# Patient Record
Sex: Female | Born: 1950
Health system: Southern US, Community
[De-identification: ages and names within clinical notes are randomized; demographics above are authoritative.]

## PROBLEM LIST (undated history)

## (undated) DIAGNOSIS — K219 Gastro-esophageal reflux disease without esophagitis: Secondary | ICD-10-CM

## (undated) DIAGNOSIS — M1990 Unspecified osteoarthritis, unspecified site: Secondary | ICD-10-CM

## (undated) DIAGNOSIS — J309 Allergic rhinitis, unspecified: Secondary | ICD-10-CM

## (undated) DIAGNOSIS — E785 Hyperlipidemia, unspecified: Secondary | ICD-10-CM

## (undated) DIAGNOSIS — I10 Essential (primary) hypertension: Secondary | ICD-10-CM

## (undated) DIAGNOSIS — F32A Depression, unspecified: Secondary | ICD-10-CM

## (undated) DIAGNOSIS — E119 Type 2 diabetes mellitus without complications: Secondary | ICD-10-CM

## (undated) DIAGNOSIS — J302 Other seasonal allergic rhinitis: Secondary | ICD-10-CM

## (undated) HISTORY — PX: DILATION AND CURETTAGE OF UTERUS: SHX78

## (undated) HISTORY — PX: ENDOMETRIAL ABLATION W/ NOVASURE: SUR434

---

## 2005-11-26 ENCOUNTER — Ambulatory Visit: Payer: Self-pay | Admitting: Family Medicine

## 2006-07-21 ENCOUNTER — Ambulatory Visit: Payer: Self-pay | Admitting: Obstetrics and Gynecology

## 2008-08-02 ENCOUNTER — Ambulatory Visit: Payer: Self-pay

## 2010-08-06 ENCOUNTER — Ambulatory Visit: Payer: Self-pay | Admitting: Family Medicine

## 2010-08-30 ENCOUNTER — Ambulatory Visit: Payer: Self-pay | Admitting: Family Medicine

## 2010-09-30 ENCOUNTER — Ambulatory Visit: Payer: Self-pay | Admitting: Family Medicine

## 2013-03-26 ENCOUNTER — Ambulatory Visit: Payer: Self-pay | Admitting: Family Medicine

## 2014-08-08 DIAGNOSIS — E785 Hyperlipidemia, unspecified: Secondary | ICD-10-CM | POA: Insufficient documentation

## 2014-08-08 DIAGNOSIS — K219 Gastro-esophageal reflux disease without esophagitis: Secondary | ICD-10-CM | POA: Insufficient documentation

## 2014-08-08 DIAGNOSIS — E119 Type 2 diabetes mellitus without complications: Secondary | ICD-10-CM | POA: Insufficient documentation

## 2014-08-08 DIAGNOSIS — J302 Other seasonal allergic rhinitis: Secondary | ICD-10-CM | POA: Insufficient documentation

## 2014-08-08 DIAGNOSIS — I10 Essential (primary) hypertension: Secondary | ICD-10-CM | POA: Insufficient documentation

## 2015-05-17 ENCOUNTER — Other Ambulatory Visit: Payer: Self-pay | Admitting: Family Medicine

## 2015-05-17 DIAGNOSIS — Z1239 Encounter for other screening for malignant neoplasm of breast: Secondary | ICD-10-CM

## 2015-05-25 ENCOUNTER — Ambulatory Visit
Admission: RE | Admit: 2015-05-25 | Discharge: 2015-05-25 | Disposition: A | Discharge status: Home / Self Care | Payer: Commercial Managed Care - PPO | Source: Ambulatory Visit | Attending: Family Medicine | Admitting: Family Medicine

## 2015-05-25 DIAGNOSIS — Z1239 Encounter for other screening for malignant neoplasm of breast: Secondary | ICD-10-CM

## 2015-05-25 DIAGNOSIS — Z1231 Encounter for screening mammogram for malignant neoplasm of breast: Secondary | ICD-10-CM | POA: Insufficient documentation

## 2015-08-10 ENCOUNTER — Encounter: Payer: Self-pay | Admitting: *Deleted

## 2015-08-11 ENCOUNTER — Encounter: Payer: Self-pay | Admitting: *Deleted

## 2015-08-11 ENCOUNTER — Ambulatory Visit: Payer: Commercial Managed Care - PPO | Admitting: Anesthesiology

## 2015-08-11 ENCOUNTER — Encounter
Admission: RE | Disposition: A | Discharge status: Home / Self Care | Payer: Self-pay | Source: Ambulatory Visit | Attending: Gastroenterology

## 2015-08-11 ENCOUNTER — Ambulatory Visit
Admission: RE | Admit: 2015-08-11 | Discharge: 2015-08-11 | Disposition: A | Discharge status: Home / Self Care | Payer: Commercial Managed Care - PPO | Source: Ambulatory Visit | Attending: Gastroenterology | Admitting: Gastroenterology

## 2015-08-11 DIAGNOSIS — Z7984 Long term (current) use of oral hypoglycemic drugs: Secondary | ICD-10-CM | POA: Diagnosis not present

## 2015-08-11 DIAGNOSIS — Z79899 Other long term (current) drug therapy: Secondary | ICD-10-CM | POA: Diagnosis not present

## 2015-08-11 DIAGNOSIS — Z6841 Body Mass Index (BMI) 40.0 and over, adult: Secondary | ICD-10-CM | POA: Diagnosis not present

## 2015-08-11 DIAGNOSIS — G4730 Sleep apnea, unspecified: Secondary | ICD-10-CM | POA: Insufficient documentation

## 2015-08-11 DIAGNOSIS — I10 Essential (primary) hypertension: Secondary | ICD-10-CM | POA: Diagnosis not present

## 2015-08-11 DIAGNOSIS — E669 Obesity, unspecified: Secondary | ICD-10-CM | POA: Insufficient documentation

## 2015-08-11 DIAGNOSIS — E109 Type 1 diabetes mellitus without complications: Secondary | ICD-10-CM | POA: Insufficient documentation

## 2015-08-11 DIAGNOSIS — J309 Allergic rhinitis, unspecified: Secondary | ICD-10-CM | POA: Diagnosis not present

## 2015-08-11 DIAGNOSIS — E785 Hyperlipidemia, unspecified: Secondary | ICD-10-CM | POA: Diagnosis not present

## 2015-08-11 DIAGNOSIS — Z1211 Encounter for screening for malignant neoplasm of colon: Secondary | ICD-10-CM | POA: Insufficient documentation

## 2015-08-11 HISTORY — DX: Type 2 diabetes mellitus without complications: E11.9

## 2015-08-11 HISTORY — DX: Other seasonal allergic rhinitis: J30.2

## 2015-08-11 HISTORY — DX: Essential (primary) hypertension: I10

## 2015-08-11 HISTORY — DX: Hyperlipidemia, unspecified: E78.5

## 2015-08-11 HISTORY — PX: COLONOSCOPY WITH PROPOFOL: SHX5780

## 2015-08-11 HISTORY — DX: Allergic rhinitis, unspecified: J30.9

## 2015-08-11 LAB — GLUCOSE, CAPILLARY: GLUCOSE-CAPILLARY: 196 mg/dL — AB (ref 65–99)

## 2015-08-11 SURGERY — COLONOSCOPY WITH PROPOFOL
Anesthesia: General

## 2015-08-11 MED ORDER — SODIUM CHLORIDE 0.9 % IV SOLN: INTRAVENOUS | Status: DC

## 2015-08-11 MED ORDER — PROPOFOL 500 MG/50ML IV EMUL
INTRAVENOUS | Status: DC | PRN
  Administered 2015-08-11: 75 ug/kg/min via INTRAVENOUS

## 2015-08-11 MED ORDER — SODIUM CHLORIDE 0.9 % IV SOLN
INTRAVENOUS | Status: DC
  Administered 2015-08-11 (×2): via INTRAVENOUS

## 2015-08-11 MED ORDER — PROPOFOL 10 MG/ML IV BOLUS
INTRAVENOUS | Status: DC | PRN
  Administered 2015-08-11: 50 mg via INTRAVENOUS

## 2015-08-11 MED ORDER — MIDAZOLAM HCL 2 MG/2ML IJ SOLN
INTRAMUSCULAR | Status: DC | PRN
  Administered 2015-08-11: 1 mg via INTRAVENOUS

## 2015-08-11 MED ORDER — FENTANYL CITRATE (PF) 100 MCG/2ML IJ SOLN
INTRAMUSCULAR | Status: DC | PRN
  Administered 2015-08-11: 50 ug via INTRAVENOUS

## 2015-08-11 NOTE — Anesthesia Preprocedure Evaluation (Signed)
Anesthesia Evaluation  Patient identified by MRN, date of birth, ID band Patient awake    Reviewed: Allergy & Precautions, NPO status   Airway Mallampati: III       Dental no notable dental hx.    Pulmonary sleep apnea ,     + decreased breath sounds      Cardiovascular hypertension, Pt. on medications      Neuro/Psych    GI/Hepatic negative GI ROS, Neg liver ROS,   Endo/Other  diabetes, Well Controlled, Type 1, Insulin DependentMorbid obesity  Renal/GU negative Renal ROS     Musculoskeletal   Abdominal (+) + obese,   Peds  Hematology   Anesthesia Other Findings   Reproductive/Obstetrics                             Anesthesia Physical Anesthesia Plan  ASA: III  Anesthesia Plan: General   Post-op Pain Management:    Induction: Intravenous  Airway Management Planned: Nasal Cannula  Additional Equipment:   Intra-op Plan:   Post-operative Plan:   Informed Consent: I have reviewed the patients History and Physical, chart, labs and discussed the procedure including the risks, benefits and alternatives for the proposed anesthesia with the patient or authorized representative who has indicated his/her understanding and acceptance.     Plan Discussed with: CRNA  Anesthesia Plan Comments:         Anesthesia Quick Evaluation

## 2015-08-11 NOTE — Anesthesia Postprocedure Evaluation (Signed)
  Anesthesia Post-op Note  Patient: Amy Keith  Procedure(s) Performed: Procedure(s): COLONOSCOPY WITH PROPOFOL (N/A)  Anesthesia type:General  Patient location: PACU  Post pain: Pain level controlled  Post assessment: Post-op Vital signs reviewed, Patient's Cardiovascular Status Stable, Respiratory Function Stable, Patent Airway and No signs of Nausea or vomiting  Post vital signs: Reviewed and stable  Last Vitals:  Filed Vitals:   08/11/15 0833  BP: 119/81  Pulse: 73  Temp:   Resp: 14    Level of consciousness: awake, alert  and patient cooperative  Complications: No apparent anesthesia complications

## 2015-08-11 NOTE — H&P (Signed)
    Primary Care Physician:  Juluis Pitch, MD Primary Gastroenterologist:  Dr. Candace Cruise  Pre-Procedure History & Physical: HPI:  Amy Keith is a 64 y.o. female is here for an colonoscopy.   Past Medical History  Diagnosis Date  . Diabetes mellitus without complication ([place])   . Hypertension   . Hyperlipidemia   . Allergic rhinitis   . Seasonal allergies     Past Surgical History  Procedure Laterality Date  . Dilation and curettage of uterus    . Endometrial ablation w/ novasure      Prior to Admission medications   Medication Sig Start Date End Date Taking? Authorizing Provider  amitriptyline (ELAVIL) 50 MG tablet Take 50 mg by mouth at bedtime.   Yes Historical Provider, MD  atorvastatin (LIPITOR) 20 MG tablet Take 20 mg by mouth daily.   Yes Historical Provider, MD  bisoprolol-hydrochlorothiazide (ZIAC) 10-6.25 MG tablet Take 1 tablet by mouth daily.   Yes Historical Provider, MD  glipiZIDE (GLUCOTROL) 5 MG tablet Take by mouth daily before breakfast.   Yes Historical Provider, MD  insulin detemir (LEVEMIR) 100 UNIT/ML injection Inject 24 Units into the skin at bedtime.   Yes Historical Provider, MD  lisinopril (PRINIVIL,ZESTRIL) 10 MG tablet Take 10 mg by mouth daily.   Yes Historical Provider, MD  metFORMIN (GLUCOPHAGE) 500 MG tablet Take 1,000 mg by mouth 2 (two) times daily with a meal.   Yes Historical Provider, MD  omeprazole (PRILOSEC) 20 MG capsule Take 20 mg by mouth daily.   Yes Historical Provider, MD    Allergies as of 06/27/2015  . (Not on File)    History reviewed. No pertinent family history.  Social History   Social History  . Marital Status: Married    Spouse Name: N/A  . Number of Children: N/A  . Years of Education: N/A   Occupational History  . Not on file.   Social History Main Topics  . Smoking status: Never Smoker   . Smokeless tobacco: Never Used  . Alcohol Use: No  . Drug Use: No  . Sexual Activity: Not on file   Other Topics  Concern  . Not on file   Social History Narrative    Review of Systems: See HPI, otherwise negative ROS  Physical Exam: BP 131/63 mmHg  Pulse 88  Temp(Src) 96.5 F (35.8 C) (Tympanic)  Resp 18  Ht 5' (1.524 m)  Wt 97.523 kg (215 lb)  BMI 41.99 kg/m2  SpO2 97% General:   Alert,  pleasant and cooperative in NAD Head:  Normocephalic and atraumatic. Neck:  Supple; no masses or thyromegaly. Lungs:  Clear throughout to auscultation.    Heart:  Regular rate and rhythm. Abdomen:  Soft, nontender and nondistended. Normal bowel sounds, without guarding, and without rebound.   Neurologic:  Alert and  oriented x4;  grossly normal neurologically.  Impression/Plan: Amy Keith is here for an colonoscopy to be performed for screening.  Risks, benefits, limitations, and alternatives regarding  colonoscopy have been reviewed with the patient.  Questions have been answered.  All parties agreeable.   Albertha Beattie, Lupita Dawn, MD  08/11/2015, 8:00 AM

## 2015-08-11 NOTE — Addendum Note (Signed)
Addendum  created 08/11/15 0849 by Nelda Marseille, CRNA   Modules edited: Anesthesia Flowsheet, Anesthesia LDA, Charges VN, Lines/Drains/Airways Properties Editor   Lines/Drains/Airways Properties Editor:  Properties of line/drain/airway/wound Airway 7 mm have been modified.

## 2015-08-11 NOTE — Transfer of Care (Signed)
Immediate Anesthesia Transfer of Care Note  Patient: Amy Keith  Procedure(s) Performed: Procedure(s): COLONOSCOPY WITH PROPOFOL (N/A)  Patient Location: PACU  Anesthesia Type:General  Level of Consciousness: awake, oriented and sedated  Airway & Oxygen Therapy: Patient Spontanous Breathing and Patient connected to nasal cannula oxygen  Post-op Assessment: Report given to RN and Post -op Vital signs reviewed and stable  Post vital signs: Reviewed and stable  Last Vitals:  Filed Vitals:   08/11/15 0713  BP: 131/63  Pulse: 88  Temp: 35.8 C  Resp: 18    Complications: No apparent anesthesia complications

## 2015-08-11 NOTE — Op Note (Signed)
[facility] Gastroenterology Patient Name: Amy Keith Procedure Date: 08/11/2015 8:03 AM MRN: [id number] Account #: [id number] Date of Birth: [birth year] Admit Type: Outpatient Age: 64 Room: [facility] ENDO ROOM 4 Gender: Female Note Status: Finalized Procedure:         Colonoscopy Indications:       Screening for colorectal malignant neoplasm Providers:         Lupita Dawn. Candace Cruise, MD Referring MD:      Youlanda Roys. Lovie Macadamia, MD (Referring MD) Medicines:         Monitored Anesthesia Care Complications:     No immediate complications. Procedure:         Pre-Anesthesia Assessment:                    - Prior to the procedure, a History and Physical was                     performed, and patient medications, allergies and                     sensitivities were reviewed. The patient's tolerance of                     previous anesthesia was reviewed.                    - The risks and benefits of the procedure and the sedation                     options and risks were discussed with the patient. All                     questions were answered and informed consent was obtained.                    - After reviewing the risks and benefits, the patient was                     deemed in satisfactory condition to undergo the procedure.                    After obtaining informed consent, the colonoscope was                     passed under direct vision. Throughout the procedure, the                     patient's blood pressure, pulse, and oxygen saturations                     were monitored continuously. The Olympus CF-H180AL                     colonoscope ( S#: [id number] ) was introduced through the                     anus and advanced to the the cecum, identified by                     appendiceal orifice and ileocecal valve. The colonoscopy                     was performed without difficulty. The patient tolerated  the procedure well. The quality of the bowel  preparation                     was good. Findings:      The colon (entire examined portion) appeared normal. Impression:        - The entire examined colon is normal.                    - No specimens collected. Recommendation:    - Discharge patient to home.                    - Repeat colonoscopy in 10 years for surveillance.                    - The findings and recommendations were discussed with the                     patient. Procedure Code(s): --- Professional ---                    [phone], Colonoscopy, flexible; diagnostic, including                     collection of specimen(s) by brushing or washing, when                     performed (separate procedure) Diagnosis Code(s): --- Professional ---                    Z12.11, Encounter for screening for malignant neoplasm of                     colon CPT copyright 2014 American Medical Association. All rights reserved. The codes documented in this report are preliminary and upon coder review may  be revised to meet current compliance requirements. Hulen Luster, MD 08/11/2015 8:19:05 AM This report has been signed electronically. Number of Addenda: 0 Note Initiated On: 08/11/2015 8:03 AM Scope Withdrawal Time: 0 hours 3 minutes 17 seconds  Total Procedure Duration: 0 hours 5 minutes 57 seconds       [facility]

## 2015-08-16 ENCOUNTER — Encounter: Payer: Self-pay | Admitting: Gastroenterology

## 2016-05-10 DIAGNOSIS — Z6841 Body Mass Index (BMI) 40.0 and over, adult: Secondary | ICD-10-CM

## 2016-07-04 ENCOUNTER — Other Ambulatory Visit: Payer: Self-pay | Admitting: Family Medicine

## 2016-07-04 DIAGNOSIS — Z1231 Encounter for screening mammogram for malignant neoplasm of breast: Secondary | ICD-10-CM

## 2016-07-31 ENCOUNTER — Ambulatory Visit
Admission: RE | Admit: 2016-07-31 | Discharge: 2016-07-31 | Disposition: A | Discharge status: Home / Self Care | Payer: Commercial Managed Care - PPO | Source: Ambulatory Visit | Attending: Family Medicine | Admitting: Family Medicine

## 2016-07-31 DIAGNOSIS — Z1231 Encounter for screening mammogram for malignant neoplasm of breast: Secondary | ICD-10-CM | POA: Insufficient documentation

## 2017-05-14 ENCOUNTER — Other Ambulatory Visit: Payer: Self-pay | Admitting: Student

## 2017-05-14 DIAGNOSIS — K219 Gastro-esophageal reflux disease without esophagitis: Secondary | ICD-10-CM | POA: Diagnosis not present

## 2017-05-14 DIAGNOSIS — M797 Fibromyalgia: Secondary | ICD-10-CM | POA: Diagnosis not present

## 2017-05-14 DIAGNOSIS — E785 Hyperlipidemia, unspecified: Secondary | ICD-10-CM | POA: Diagnosis not present

## 2017-05-14 DIAGNOSIS — F430 Acute stress reaction: Secondary | ICD-10-CM | POA: Diagnosis not present

## 2017-05-14 DIAGNOSIS — I10 Essential (primary) hypertension: Secondary | ICD-10-CM | POA: Diagnosis not present

## 2017-05-14 DIAGNOSIS — Z794 Long term (current) use of insulin: Secondary | ICD-10-CM | POA: Diagnosis not present

## 2017-05-14 DIAGNOSIS — Z79899 Other long term (current) drug therapy: Secondary | ICD-10-CM | POA: Diagnosis not present

## 2017-05-14 DIAGNOSIS — E1165 Type 2 diabetes mellitus with hyperglycemia: Secondary | ICD-10-CM | POA: Diagnosis not present

## 2017-05-14 DIAGNOSIS — Z0000 Encounter for general adult medical examination without abnormal findings: Secondary | ICD-10-CM | POA: Diagnosis not present

## 2017-05-14 DIAGNOSIS — Z1239 Encounter for other screening for malignant neoplasm of breast: Secondary | ICD-10-CM

## 2017-05-14 DIAGNOSIS — Z1329 Encounter for screening for other suspected endocrine disorder: Secondary | ICD-10-CM | POA: Diagnosis not present

## 2017-05-15 DIAGNOSIS — E1165 Type 2 diabetes mellitus with hyperglycemia: Secondary | ICD-10-CM | POA: Diagnosis not present

## 2017-05-15 DIAGNOSIS — Z794 Long term (current) use of insulin: Secondary | ICD-10-CM | POA: Diagnosis not present

## 2017-05-29 ENCOUNTER — Encounter: Payer: Commercial Managed Care - PPO | Attending: Student | Admitting: Dietician

## 2017-05-29 ENCOUNTER — Encounter: Payer: Self-pay | Admitting: Dietician

## 2017-05-29 VITALS — Ht 61.0 in | Wt 212.3 lb

## 2017-05-29 DIAGNOSIS — I10 Essential (primary) hypertension: Secondary | ICD-10-CM | POA: Insufficient documentation

## 2017-05-29 DIAGNOSIS — E669 Obesity, unspecified: Secondary | ICD-10-CM | POA: Diagnosis present

## 2017-05-29 DIAGNOSIS — E119 Type 2 diabetes mellitus without complications: Secondary | ICD-10-CM | POA: Diagnosis not present

## 2017-05-29 DIAGNOSIS — Z6841 Body Mass Index (BMI) 40.0 and over, adult: Secondary | ICD-10-CM | POA: Insufficient documentation

## 2017-05-29 DIAGNOSIS — E782 Mixed hyperlipidemia: Secondary | ICD-10-CM | POA: Diagnosis present

## 2017-05-29 NOTE — Progress Notes (Signed)
Medical Nutrition Therapy: Visit start time: 1330  end time: 1430  Assessment:  Diagnosis: Diabetes, obesity, HTN, Hyperlipidemia Past medical history: fibromyalgia Psychosocial issues/ stress concerns: none, will be semi- retiring from her job within the next week Preferred learning method:  . Hands-on   Current weight: 212.3lbs  Height: 5'1" Medications, supplements: reconciled list in medical record  Progress and evaluation: Patient reports 15-year history of Type 2 DM, but BG increased significantly within past several months. She wants to implement diet and lifestyle changes to improve her health, and is now ready to do so, as she is quitting her regular work hours, will work prn only. She cares for her disabled husband at home.    Physical activity: none at this time; will be starting in [employer] program  Dietary Intake:  Usual eating pattern includes 2-3 meals and 0 snacks per day. Dining out frequency: 1 meals per week.  Breakfast: special k with berries; instant flavored oatmeal; oatmeal cake (little debbie)- when rushed; honey nut cheerios Snack: none Lunch: often skips; occasionally fruit or vegetables from cafeteria at work, avoids other menu items due to heavy sauces and gravies Snack: none Supper: spaghetti, cubed steak in nonstick skillet, ham and potatoes (white or sweet); occ brown rice Snack: none Beverages: diet soda 1-2 daily; water only at work; sugar free fruit drinks  Nutrition Care Education: Topics covered: diabetes, weight control Basic nutrition: basic food groups, appropriate nutrient balance, appropriate meal and snack schedule, general nutrition guidelines    Weight control: benefits of weight control, importance of eating at regular intervals, food portions, low fat and low sugar food choices, role of exercise Diabetes: appropriate meal and snack schedule, appropriate carb intake and balance, healthy carb food choices, basic meal planning using  plate method, benefits of regular exercise in controlling BGs and also BP and lipids Hypertension: identifying high sodium foods, identifying food sources of, potassium, magnesium Hyperlipidemia:  healthy and unhealthy fats, role of fiber   Nutritional Diagnosis:  [place]-2.2 Altered nutrition-related laboratory As related to diabetes.  As evidenced by HbA1C 9.6% on 08/16/16, patient BG report. [place]-3.3 Overweight/obesity As related to excess calories, inactivity, stress.  As evidenced by BMI 40, patient report.  Intervention: Instruction as noted above.   Set goals with direction from patient.   She declined RD follow-up at this time, but will schedule later if needed.  Education Materials given:  . General diet guidelines for Diabetes . Plate Planner with food lists . Sample meal pattern/ menus . Goals/ instructions  Learner/ who was taught:  . Patient   Level of understanding: Marland Kitchen Verbalizes/ demonstrates competency  Demonstrated degree of understanding via:   Teach back Learning barriers: . None  Willingness to learn/ readiness for change: . Eager, change in progress  Monitoring and Evaluation:  Dietary intake, exercise, BG control, and body weight      follow up: prn

## 2017-05-29 NOTE — Patient Instructions (Signed)
   Make sure to eat something every 3-5 hours during the day.   Include a protein food along with controlled portions of starch with each meal. Add a serving of a vegetable and/or fruit for a well-balanced meal.  Start some regular exercise with [employer] program; exercise will help control blood sugars, blood pressure, etc.

## 2017-08-14 DIAGNOSIS — M8588 Other specified disorders of bone density and structure, other site: Secondary | ICD-10-CM | POA: Diagnosis not present

## 2017-08-18 DIAGNOSIS — E785 Hyperlipidemia, unspecified: Secondary | ICD-10-CM | POA: Diagnosis not present

## 2017-08-18 DIAGNOSIS — E118 Type 2 diabetes mellitus with unspecified complications: Secondary | ICD-10-CM | POA: Diagnosis not present

## 2017-08-18 DIAGNOSIS — Z794 Long term (current) use of insulin: Secondary | ICD-10-CM | POA: Diagnosis not present

## 2017-08-18 DIAGNOSIS — I10 Essential (primary) hypertension: Secondary | ICD-10-CM | POA: Diagnosis not present

## 2018-06-08 DIAGNOSIS — H8111 Benign paroxysmal vertigo, right ear: Secondary | ICD-10-CM | POA: Diagnosis not present

## 2018-06-08 DIAGNOSIS — I10 Essential (primary) hypertension: Secondary | ICD-10-CM | POA: Diagnosis not present

## 2018-06-08 DIAGNOSIS — H6123 Impacted cerumen, bilateral: Secondary | ICD-10-CM | POA: Diagnosis not present

## 2018-06-08 DIAGNOSIS — E1165 Type 2 diabetes mellitus with hyperglycemia: Secondary | ICD-10-CM | POA: Diagnosis not present

## 2018-06-08 DIAGNOSIS — Z794 Long term (current) use of insulin: Secondary | ICD-10-CM | POA: Diagnosis not present

## 2018-06-13 DIAGNOSIS — H1033 Unspecified acute conjunctivitis, bilateral: Secondary | ICD-10-CM | POA: Diagnosis not present

## 2018-06-23 DIAGNOSIS — H524 Presbyopia: Secondary | ICD-10-CM | POA: Diagnosis not present

## 2018-07-14 DIAGNOSIS — R739 Hyperglycemia, unspecified: Secondary | ICD-10-CM | POA: Diagnosis not present

## 2018-07-14 DIAGNOSIS — E782 Mixed hyperlipidemia: Secondary | ICD-10-CM | POA: Diagnosis not present

## 2018-07-16 DIAGNOSIS — K219 Gastro-esophageal reflux disease without esophagitis: Secondary | ICD-10-CM | POA: Diagnosis not present

## 2018-07-16 DIAGNOSIS — E1165 Type 2 diabetes mellitus with hyperglycemia: Secondary | ICD-10-CM | POA: Diagnosis not present

## 2018-07-16 DIAGNOSIS — E785 Hyperlipidemia, unspecified: Secondary | ICD-10-CM | POA: Diagnosis not present

## 2018-07-16 DIAGNOSIS — N898 Other specified noninflammatory disorders of vagina: Secondary | ICD-10-CM | POA: Diagnosis not present

## 2018-07-16 DIAGNOSIS — F419 Anxiety disorder, unspecified: Secondary | ICD-10-CM | POA: Diagnosis not present

## 2018-07-16 DIAGNOSIS — M797 Fibromyalgia: Secondary | ICD-10-CM | POA: Diagnosis not present

## 2018-07-16 DIAGNOSIS — I10 Essential (primary) hypertension: Secondary | ICD-10-CM | POA: Diagnosis not present

## 2018-07-16 DIAGNOSIS — Z79899 Other long term (current) drug therapy: Secondary | ICD-10-CM | POA: Diagnosis not present

## 2018-08-17 DIAGNOSIS — J209 Acute bronchitis, unspecified: Secondary | ICD-10-CM | POA: Diagnosis not present

## 2018-08-31 DIAGNOSIS — F419 Anxiety disorder, unspecified: Secondary | ICD-10-CM | POA: Diagnosis not present

## 2018-08-31 DIAGNOSIS — M797 Fibromyalgia: Secondary | ICD-10-CM | POA: Diagnosis not present

## 2018-08-31 DIAGNOSIS — I10 Essential (primary) hypertension: Secondary | ICD-10-CM | POA: Diagnosis not present

## 2018-08-31 DIAGNOSIS — E1165 Type 2 diabetes mellitus with hyperglycemia: Secondary | ICD-10-CM | POA: Diagnosis not present

## 2018-08-31 DIAGNOSIS — E785 Hyperlipidemia, unspecified: Secondary | ICD-10-CM | POA: Diagnosis not present

## 2018-08-31 DIAGNOSIS — Z794 Long term (current) use of insulin: Secondary | ICD-10-CM | POA: Diagnosis not present

## 2018-10-09 DIAGNOSIS — E785 Hyperlipidemia, unspecified: Secondary | ICD-10-CM | POA: Diagnosis not present

## 2018-10-09 DIAGNOSIS — Z794 Long term (current) use of insulin: Secondary | ICD-10-CM | POA: Diagnosis not present

## 2018-10-09 DIAGNOSIS — I10 Essential (primary) hypertension: Secondary | ICD-10-CM | POA: Diagnosis not present

## 2018-10-09 DIAGNOSIS — E1165 Type 2 diabetes mellitus with hyperglycemia: Secondary | ICD-10-CM | POA: Diagnosis not present

## 2018-10-16 DIAGNOSIS — F419 Anxiety disorder, unspecified: Secondary | ICD-10-CM | POA: Diagnosis not present

## 2018-10-16 DIAGNOSIS — E785 Hyperlipidemia, unspecified: Secondary | ICD-10-CM | POA: Diagnosis not present

## 2018-10-16 DIAGNOSIS — M797 Fibromyalgia: Secondary | ICD-10-CM | POA: Diagnosis not present

## 2018-10-16 DIAGNOSIS — E1165 Type 2 diabetes mellitus with hyperglycemia: Secondary | ICD-10-CM | POA: Diagnosis not present

## 2018-10-16 DIAGNOSIS — Z6841 Body Mass Index (BMI) 40.0 and over, adult: Secondary | ICD-10-CM | POA: Diagnosis not present

## 2018-10-16 DIAGNOSIS — I10 Essential (primary) hypertension: Secondary | ICD-10-CM | POA: Diagnosis not present

## 2018-10-16 DIAGNOSIS — Z794 Long term (current) use of insulin: Secondary | ICD-10-CM | POA: Diagnosis not present

## 2019-02-01 DIAGNOSIS — E1165 Type 2 diabetes mellitus with hyperglycemia: Secondary | ICD-10-CM | POA: Diagnosis not present

## 2019-02-01 DIAGNOSIS — I10 Essential (primary) hypertension: Secondary | ICD-10-CM | POA: Diagnosis not present

## 2019-02-01 DIAGNOSIS — Z794 Long term (current) use of insulin: Secondary | ICD-10-CM | POA: Diagnosis not present

## 2019-02-01 DIAGNOSIS — E785 Hyperlipidemia, unspecified: Secondary | ICD-10-CM | POA: Diagnosis not present

## 2019-03-26 DIAGNOSIS — Z6841 Body Mass Index (BMI) 40.0 and over, adult: Secondary | ICD-10-CM | POA: Diagnosis not present

## 2019-03-26 DIAGNOSIS — Z23 Encounter for immunization: Secondary | ICD-10-CM | POA: Diagnosis not present

## 2019-03-26 DIAGNOSIS — Z794 Long term (current) use of insulin: Secondary | ICD-10-CM | POA: Diagnosis not present

## 2019-03-26 DIAGNOSIS — Z0000 Encounter for general adult medical examination without abnormal findings: Secondary | ICD-10-CM | POA: Diagnosis not present

## 2019-03-26 DIAGNOSIS — E119 Type 2 diabetes mellitus without complications: Secondary | ICD-10-CM | POA: Diagnosis not present

## 2019-03-26 DIAGNOSIS — E785 Hyperlipidemia, unspecified: Secondary | ICD-10-CM | POA: Diagnosis not present

## 2019-03-26 DIAGNOSIS — Z1239 Encounter for other screening for malignant neoplasm of breast: Secondary | ICD-10-CM | POA: Diagnosis not present

## 2019-03-26 DIAGNOSIS — I10 Essential (primary) hypertension: Secondary | ICD-10-CM | POA: Diagnosis not present

## 2019-04-05 DIAGNOSIS — M5412 Radiculopathy, cervical region: Secondary | ICD-10-CM | POA: Diagnosis not present

## 2019-04-05 DIAGNOSIS — M542 Cervicalgia: Secondary | ICD-10-CM | POA: Diagnosis not present

## 2019-04-05 DIAGNOSIS — M4722 Other spondylosis with radiculopathy, cervical region: Secondary | ICD-10-CM | POA: Diagnosis not present

## 2019-04-14 ENCOUNTER — Other Ambulatory Visit: Payer: Self-pay | Admitting: Family Medicine

## 2019-04-14 DIAGNOSIS — Z1231 Encounter for screening mammogram for malignant neoplasm of breast: Secondary | ICD-10-CM

## 2019-05-27 DIAGNOSIS — Z20828 Contact with and (suspected) exposure to other viral communicable diseases: Secondary | ICD-10-CM | POA: Diagnosis not present

## 2019-05-28 ENCOUNTER — Ambulatory Visit
Admission: RE | Admit: 2019-05-28 | Discharge: 2019-05-28 | Disposition: A | Discharge status: Home / Self Care | Payer: Medicare HMO | Source: Ambulatory Visit | Attending: Family Medicine | Admitting: Family Medicine

## 2019-05-28 DIAGNOSIS — Z1231 Encounter for screening mammogram for malignant neoplasm of breast: Secondary | ICD-10-CM | POA: Diagnosis not present

## 2019-05-28 DIAGNOSIS — Z20828 Contact with and (suspected) exposure to other viral communicable diseases: Secondary | ICD-10-CM | POA: Diagnosis not present

## 2019-06-01 DIAGNOSIS — Z20828 Contact with and (suspected) exposure to other viral communicable diseases: Secondary | ICD-10-CM | POA: Diagnosis not present

## 2019-06-03 ENCOUNTER — Other Ambulatory Visit: Payer: Self-pay

## 2019-06-03 DIAGNOSIS — Z20822 Contact with and (suspected) exposure to covid-19: Secondary | ICD-10-CM

## 2019-06-03 DIAGNOSIS — R6889 Other general symptoms and signs: Secondary | ICD-10-CM | POA: Diagnosis not present

## 2019-06-04 LAB — NOVEL CORONAVIRUS, NAA: SARS-CoV-2, NAA: NOT DETECTED

## 2019-10-20 ENCOUNTER — Ambulatory Visit: Payer: Medicare HMO | Attending: Internal Medicine

## 2019-10-20 DIAGNOSIS — Z23 Encounter for immunization: Secondary | ICD-10-CM | POA: Insufficient documentation

## 2019-10-20 NOTE — Progress Notes (Signed)
   Covid-19 Vaccination Clinic  Name:  Amy Keith    MRN: [id number] DOB: August 19, 1951  10/20/2019  Amy Keith was observed post Covid-19 immunization for 15 minutes without incidence. She was provided with Vaccine Information Sheet and instruction to access the V-Safe system.   Amy Keith was instructed to call 911 with any severe reactions post vaccine: Marland Kitchen Difficulty breathing  . Swelling of your face and throat  . A fast heartbeat  . A bad rash all over your body  . Dizziness and weakness    Immunizations Administered    Name Date Dose VIS Date Route   Pfizer COVID-19 Vaccine 10/20/2019 11:34 AM 0.3 mL 09/10/2019 Intramuscular   Manufacturer: [place]   Lot: [id number]   [place]: [id number]

## 2019-11-08 ENCOUNTER — Ambulatory Visit: Payer: Medicare HMO | Attending: Internal Medicine

## 2019-11-08 DIAGNOSIS — Z23 Encounter for immunization: Secondary | ICD-10-CM | POA: Insufficient documentation

## 2019-11-08 NOTE — Progress Notes (Signed)
   Covid-19 Vaccination Clinic  Name:  LERLENE PEACH    MRN: [id number] DOB: Nov 08, 1950  11/08/2019  Ms. Nuzzo was observed post Covid-19 immunization for 15 minutes without incidence. She was provided with Vaccine Information Sheet and instruction to access the V-Safe system.   Ms. Houston was instructed to call 911 with any severe reactions post vaccine: Marland Kitchen Difficulty breathing  . Swelling of your face and throat  . A fast heartbeat  . A bad rash all over your body  . Dizziness and weakness    Immunizations Administered    Name Date Dose VIS Date Route   Pfizer COVID-19 Vaccine 11/08/2019  4:26 PM 0.3 mL 09/10/2019 Intramuscular   Manufacturer: [place]   Lot: [id number]   [place]: [id number]

## 2019-12-15 DIAGNOSIS — Z794 Long term (current) use of insulin: Secondary | ICD-10-CM | POA: Diagnosis not present

## 2019-12-15 DIAGNOSIS — M25471 Effusion, right ankle: Secondary | ICD-10-CM | POA: Diagnosis not present

## 2019-12-15 DIAGNOSIS — M797 Fibromyalgia: Secondary | ICD-10-CM | POA: Diagnosis not present

## 2019-12-15 DIAGNOSIS — F419 Anxiety disorder, unspecified: Secondary | ICD-10-CM | POA: Diagnosis not present

## 2019-12-15 DIAGNOSIS — I10 Essential (primary) hypertension: Secondary | ICD-10-CM | POA: Diagnosis not present

## 2019-12-15 DIAGNOSIS — E782 Mixed hyperlipidemia: Secondary | ICD-10-CM | POA: Diagnosis not present

## 2019-12-15 DIAGNOSIS — M25475 Effusion, left foot: Secondary | ICD-10-CM | POA: Diagnosis not present

## 2019-12-15 DIAGNOSIS — E1165 Type 2 diabetes mellitus with hyperglycemia: Secondary | ICD-10-CM | POA: Diagnosis not present

## 2019-12-15 DIAGNOSIS — F329 Major depressive disorder, single episode, unspecified: Secondary | ICD-10-CM | POA: Diagnosis not present

## 2019-12-17 DIAGNOSIS — M797 Fibromyalgia: Secondary | ICD-10-CM | POA: Diagnosis not present

## 2019-12-17 DIAGNOSIS — Z794 Long term (current) use of insulin: Secondary | ICD-10-CM | POA: Diagnosis not present

## 2019-12-17 DIAGNOSIS — F419 Anxiety disorder, unspecified: Secondary | ICD-10-CM | POA: Diagnosis not present

## 2019-12-17 DIAGNOSIS — M25471 Effusion, right ankle: Secondary | ICD-10-CM | POA: Diagnosis not present

## 2019-12-17 DIAGNOSIS — E782 Mixed hyperlipidemia: Secondary | ICD-10-CM | POA: Diagnosis not present

## 2019-12-17 DIAGNOSIS — M25475 Effusion, left foot: Secondary | ICD-10-CM | POA: Diagnosis not present

## 2019-12-17 DIAGNOSIS — E1165 Type 2 diabetes mellitus with hyperglycemia: Secondary | ICD-10-CM | POA: Diagnosis not present

## 2019-12-17 DIAGNOSIS — F329 Major depressive disorder, single episode, unspecified: Secondary | ICD-10-CM | POA: Diagnosis not present

## 2019-12-17 DIAGNOSIS — I10 Essential (primary) hypertension: Secondary | ICD-10-CM | POA: Diagnosis not present

## 2020-03-01 DIAGNOSIS — E119 Type 2 diabetes mellitus without complications: Secondary | ICD-10-CM | POA: Diagnosis not present

## 2020-03-24 DIAGNOSIS — H524 Presbyopia: Secondary | ICD-10-CM | POA: Diagnosis not present

## 2020-03-24 DIAGNOSIS — H52223 Regular astigmatism, bilateral: Secondary | ICD-10-CM | POA: Diagnosis not present

## 2020-08-04 DIAGNOSIS — K219 Gastro-esophageal reflux disease without esophagitis: Secondary | ICD-10-CM | POA: Diagnosis not present

## 2020-08-04 DIAGNOSIS — E782 Mixed hyperlipidemia: Secondary | ICD-10-CM | POA: Diagnosis not present

## 2020-08-04 DIAGNOSIS — Z794 Long term (current) use of insulin: Secondary | ICD-10-CM | POA: Diagnosis not present

## 2020-08-04 DIAGNOSIS — I10 Essential (primary) hypertension: Secondary | ICD-10-CM | POA: Diagnosis not present

## 2020-08-04 DIAGNOSIS — Z6841 Body Mass Index (BMI) 40.0 and over, adult: Secondary | ICD-10-CM | POA: Diagnosis not present

## 2020-08-04 DIAGNOSIS — E119 Type 2 diabetes mellitus without complications: Secondary | ICD-10-CM | POA: Diagnosis not present

## 2020-11-13 DIAGNOSIS — E782 Mixed hyperlipidemia: Secondary | ICD-10-CM | POA: Diagnosis not present

## 2020-11-13 DIAGNOSIS — I10 Essential (primary) hypertension: Secondary | ICD-10-CM | POA: Diagnosis not present

## 2020-11-13 DIAGNOSIS — K219 Gastro-esophageal reflux disease without esophagitis: Secondary | ICD-10-CM | POA: Diagnosis not present

## 2020-11-13 DIAGNOSIS — E119 Type 2 diabetes mellitus without complications: Secondary | ICD-10-CM | POA: Diagnosis not present

## 2020-11-13 DIAGNOSIS — Z794 Long term (current) use of insulin: Secondary | ICD-10-CM | POA: Diagnosis not present

## 2020-11-15 DIAGNOSIS — Z794 Long term (current) use of insulin: Secondary | ICD-10-CM | POA: Diagnosis not present

## 2020-11-15 DIAGNOSIS — Z6841 Body Mass Index (BMI) 40.0 and over, adult: Secondary | ICD-10-CM | POA: Diagnosis not present

## 2020-11-15 DIAGNOSIS — E782 Mixed hyperlipidemia: Secondary | ICD-10-CM | POA: Diagnosis not present

## 2020-11-15 DIAGNOSIS — K219 Gastro-esophageal reflux disease without esophagitis: Secondary | ICD-10-CM | POA: Diagnosis not present

## 2020-11-15 DIAGNOSIS — I10 Essential (primary) hypertension: Secondary | ICD-10-CM | POA: Diagnosis not present

## 2020-11-15 DIAGNOSIS — E119 Type 2 diabetes mellitus without complications: Secondary | ICD-10-CM | POA: Diagnosis not present

## 2020-11-15 DIAGNOSIS — F419 Anxiety disorder, unspecified: Secondary | ICD-10-CM | POA: Diagnosis not present

## 2021-02-15 DIAGNOSIS — E538 Deficiency of other specified B group vitamins: Secondary | ICD-10-CM | POA: Diagnosis not present

## 2021-02-15 DIAGNOSIS — I10 Essential (primary) hypertension: Secondary | ICD-10-CM | POA: Diagnosis not present

## 2021-02-15 DIAGNOSIS — Z794 Long term (current) use of insulin: Secondary | ICD-10-CM | POA: Diagnosis not present

## 2021-02-15 DIAGNOSIS — E119 Type 2 diabetes mellitus without complications: Secondary | ICD-10-CM | POA: Diagnosis not present

## 2021-02-20 DIAGNOSIS — Z794 Long term (current) use of insulin: Secondary | ICD-10-CM | POA: Diagnosis not present

## 2021-02-20 DIAGNOSIS — E669 Obesity, unspecified: Secondary | ICD-10-CM | POA: Diagnosis not present

## 2021-02-20 DIAGNOSIS — Z1331 Encounter for screening for depression: Secondary | ICD-10-CM | POA: Diagnosis not present

## 2021-02-20 DIAGNOSIS — Z0000 Encounter for general adult medical examination without abnormal findings: Secondary | ICD-10-CM | POA: Diagnosis not present

## 2021-02-20 DIAGNOSIS — E119 Type 2 diabetes mellitus without complications: Secondary | ICD-10-CM | POA: Diagnosis not present

## 2021-02-20 DIAGNOSIS — Z6839 Body mass index (BMI) 39.0-39.9, adult: Secondary | ICD-10-CM | POA: Diagnosis not present

## 2021-02-20 DIAGNOSIS — I10 Essential (primary) hypertension: Secondary | ICD-10-CM | POA: Diagnosis not present

## 2021-02-21 ENCOUNTER — Other Ambulatory Visit: Payer: Self-pay | Admitting: Family Medicine

## 2021-02-21 DIAGNOSIS — Z1231 Encounter for screening mammogram for malignant neoplasm of breast: Secondary | ICD-10-CM

## 2021-02-28 ENCOUNTER — Ambulatory Visit
Admission: RE | Admit: 2021-02-28 | Discharge: 2021-02-28 | Disposition: A | Discharge status: Home / Self Care | Payer: Medicare HMO | Source: Ambulatory Visit | Attending: Family Medicine | Admitting: Family Medicine

## 2021-02-28 ENCOUNTER — Other Ambulatory Visit: Payer: Self-pay

## 2021-02-28 DIAGNOSIS — Z1231 Encounter for screening mammogram for malignant neoplasm of breast: Secondary | ICD-10-CM | POA: Diagnosis not present

## 2021-05-17 DIAGNOSIS — E119 Type 2 diabetes mellitus without complications: Secondary | ICD-10-CM | POA: Diagnosis not present

## 2021-05-17 DIAGNOSIS — Z794 Long term (current) use of insulin: Secondary | ICD-10-CM | POA: Diagnosis not present

## 2021-05-21 DIAGNOSIS — E119 Type 2 diabetes mellitus without complications: Secondary | ICD-10-CM | POA: Diagnosis not present

## 2021-05-21 DIAGNOSIS — I10 Essential (primary) hypertension: Secondary | ICD-10-CM | POA: Diagnosis not present

## 2021-05-21 DIAGNOSIS — F418 Other specified anxiety disorders: Secondary | ICD-10-CM | POA: Diagnosis not present

## 2021-05-21 DIAGNOSIS — Z794 Long term (current) use of insulin: Secondary | ICD-10-CM | POA: Diagnosis not present

## 2021-05-21 DIAGNOSIS — Z6839 Body mass index (BMI) 39.0-39.9, adult: Secondary | ICD-10-CM | POA: Diagnosis not present

## 2021-05-21 DIAGNOSIS — E782 Mixed hyperlipidemia: Secondary | ICD-10-CM | POA: Diagnosis not present

## 2021-05-21 DIAGNOSIS — R0602 Shortness of breath: Secondary | ICD-10-CM | POA: Diagnosis not present

## 2021-06-01 DIAGNOSIS — I447 Left bundle-branch block, unspecified: Secondary | ICD-10-CM | POA: Diagnosis not present

## 2021-06-01 DIAGNOSIS — I10 Essential (primary) hypertension: Secondary | ICD-10-CM | POA: Diagnosis not present

## 2021-06-01 DIAGNOSIS — R079 Chest pain, unspecified: Secondary | ICD-10-CM | POA: Diagnosis not present

## 2021-06-01 DIAGNOSIS — R0602 Shortness of breath: Secondary | ICD-10-CM | POA: Diagnosis not present

## 2021-06-01 DIAGNOSIS — E782 Mixed hyperlipidemia: Secondary | ICD-10-CM | POA: Diagnosis not present

## 2021-06-01 DIAGNOSIS — Z794 Long term (current) use of insulin: Secondary | ICD-10-CM | POA: Diagnosis not present

## 2021-06-01 DIAGNOSIS — Z6841 Body Mass Index (BMI) 40.0 and over, adult: Secondary | ICD-10-CM | POA: Diagnosis not present

## 2021-06-01 DIAGNOSIS — E119 Type 2 diabetes mellitus without complications: Secondary | ICD-10-CM | POA: Diagnosis not present

## 2021-06-27 DIAGNOSIS — I447 Left bundle-branch block, unspecified: Secondary | ICD-10-CM | POA: Diagnosis not present

## 2021-06-27 DIAGNOSIS — R0602 Shortness of breath: Secondary | ICD-10-CM | POA: Diagnosis not present

## 2021-06-27 DIAGNOSIS — R079 Chest pain, unspecified: Secondary | ICD-10-CM | POA: Diagnosis not present

## 2021-07-05 DIAGNOSIS — R0602 Shortness of breath: Secondary | ICD-10-CM | POA: Diagnosis not present

## 2021-07-05 DIAGNOSIS — R079 Chest pain, unspecified: Secondary | ICD-10-CM | POA: Diagnosis not present

## 2021-07-05 DIAGNOSIS — I10 Essential (primary) hypertension: Secondary | ICD-10-CM | POA: Diagnosis not present

## 2021-07-05 DIAGNOSIS — Z6841 Body Mass Index (BMI) 40.0 and over, adult: Secondary | ICD-10-CM | POA: Diagnosis not present

## 2021-07-05 DIAGNOSIS — E782 Mixed hyperlipidemia: Secondary | ICD-10-CM | POA: Diagnosis not present

## 2021-07-05 DIAGNOSIS — I447 Left bundle-branch block, unspecified: Secondary | ICD-10-CM | POA: Diagnosis not present

## 2021-07-05 DIAGNOSIS — E119 Type 2 diabetes mellitus without complications: Secondary | ICD-10-CM | POA: Diagnosis not present

## 2021-07-05 DIAGNOSIS — Z23 Encounter for immunization: Secondary | ICD-10-CM | POA: Diagnosis not present

## 2021-10-10 DIAGNOSIS — E119 Type 2 diabetes mellitus without complications: Secondary | ICD-10-CM | POA: Diagnosis not present

## 2021-10-10 DIAGNOSIS — E782 Mixed hyperlipidemia: Secondary | ICD-10-CM | POA: Diagnosis not present

## 2021-10-10 DIAGNOSIS — I447 Left bundle-branch block, unspecified: Secondary | ICD-10-CM | POA: Diagnosis not present

## 2021-10-10 DIAGNOSIS — I10 Essential (primary) hypertension: Secondary | ICD-10-CM | POA: Diagnosis not present

## 2021-10-10 DIAGNOSIS — Z6841 Body Mass Index (BMI) 40.0 and over, adult: Secondary | ICD-10-CM | POA: Diagnosis not present

## 2021-10-10 DIAGNOSIS — M797 Fibromyalgia: Secondary | ICD-10-CM | POA: Diagnosis not present

## 2021-10-10 DIAGNOSIS — R079 Chest pain, unspecified: Secondary | ICD-10-CM | POA: Diagnosis not present

## 2021-10-10 DIAGNOSIS — R0602 Shortness of breath: Secondary | ICD-10-CM | POA: Diagnosis not present

## 2022-01-08 DIAGNOSIS — R079 Chest pain, unspecified: Secondary | ICD-10-CM | POA: Diagnosis not present

## 2022-01-08 DIAGNOSIS — Z6841 Body Mass Index (BMI) 40.0 and over, adult: Secondary | ICD-10-CM | POA: Diagnosis not present

## 2022-01-08 DIAGNOSIS — R0602 Shortness of breath: Secondary | ICD-10-CM | POA: Diagnosis not present

## 2022-01-08 DIAGNOSIS — E782 Mixed hyperlipidemia: Secondary | ICD-10-CM | POA: Diagnosis not present

## 2022-01-08 DIAGNOSIS — I10 Essential (primary) hypertension: Secondary | ICD-10-CM | POA: Diagnosis not present

## 2022-01-08 DIAGNOSIS — I447 Left bundle-branch block, unspecified: Secondary | ICD-10-CM | POA: Diagnosis not present

## 2022-02-14 DIAGNOSIS — E119 Type 2 diabetes mellitus without complications: Secondary | ICD-10-CM | POA: Diagnosis not present

## 2022-02-20 DIAGNOSIS — E785 Hyperlipidemia, unspecified: Secondary | ICD-10-CM | POA: Diagnosis not present

## 2022-02-20 DIAGNOSIS — Z0000 Encounter for general adult medical examination without abnormal findings: Secondary | ICD-10-CM | POA: Diagnosis not present

## 2022-02-20 DIAGNOSIS — Z1389 Encounter for screening for other disorder: Secondary | ICD-10-CM | POA: Diagnosis not present

## 2022-02-20 DIAGNOSIS — K219 Gastro-esophageal reflux disease without esophagitis: Secondary | ICD-10-CM | POA: Diagnosis not present

## 2022-02-20 DIAGNOSIS — E119 Type 2 diabetes mellitus without complications: Secondary | ICD-10-CM | POA: Diagnosis not present

## 2022-02-20 DIAGNOSIS — F32A Depression, unspecified: Secondary | ICD-10-CM | POA: Diagnosis not present

## 2022-02-20 DIAGNOSIS — E669 Obesity, unspecified: Secondary | ICD-10-CM | POA: Diagnosis not present

## 2022-02-20 DIAGNOSIS — I10 Essential (primary) hypertension: Secondary | ICD-10-CM | POA: Diagnosis not present

## 2022-02-20 DIAGNOSIS — M8580 Other specified disorders of bone density and structure, unspecified site: Secondary | ICD-10-CM | POA: Diagnosis not present

## 2022-02-27 ENCOUNTER — Other Ambulatory Visit: Payer: Self-pay | Admitting: Family Medicine

## 2022-02-27 DIAGNOSIS — Z1231 Encounter for screening mammogram for malignant neoplasm of breast: Secondary | ICD-10-CM

## 2022-03-22 ENCOUNTER — Ambulatory Visit
Admission: RE | Admit: 2022-03-22 | Discharge: 2022-03-22 | Disposition: A | Discharge status: Home / Self Care | Payer: Medicare HMO | Source: Ambulatory Visit | Attending: Family Medicine | Admitting: Family Medicine

## 2022-03-22 DIAGNOSIS — Z1231 Encounter for screening mammogram for malignant neoplasm of breast: Secondary | ICD-10-CM | POA: Diagnosis not present

## 2022-03-25 DIAGNOSIS — E782 Mixed hyperlipidemia: Secondary | ICD-10-CM | POA: Diagnosis not present

## 2022-03-25 DIAGNOSIS — K219 Gastro-esophageal reflux disease without esophagitis: Secondary | ICD-10-CM | POA: Diagnosis not present

## 2022-03-25 DIAGNOSIS — E119 Type 2 diabetes mellitus without complications: Secondary | ICD-10-CM | POA: Diagnosis not present

## 2022-03-25 DIAGNOSIS — Z79899 Other long term (current) drug therapy: Secondary | ICD-10-CM | POA: Diagnosis not present

## 2022-03-25 DIAGNOSIS — I10 Essential (primary) hypertension: Secondary | ICD-10-CM | POA: Diagnosis not present

## 2022-03-25 DIAGNOSIS — E1165 Type 2 diabetes mellitus with hyperglycemia: Secondary | ICD-10-CM | POA: Diagnosis not present

## 2022-03-25 DIAGNOSIS — M8580 Other specified disorders of bone density and structure, unspecified site: Secondary | ICD-10-CM | POA: Diagnosis not present

## 2022-03-25 DIAGNOSIS — Z794 Long term (current) use of insulin: Secondary | ICD-10-CM | POA: Diagnosis not present

## 2022-03-27 DIAGNOSIS — H524 Presbyopia: Secondary | ICD-10-CM | POA: Diagnosis not present

## 2022-04-23 DIAGNOSIS — I447 Left bundle-branch block, unspecified: Secondary | ICD-10-CM | POA: Diagnosis not present

## 2022-04-23 DIAGNOSIS — R0602 Shortness of breath: Secondary | ICD-10-CM | POA: Diagnosis not present

## 2022-04-23 DIAGNOSIS — I10 Essential (primary) hypertension: Secondary | ICD-10-CM | POA: Diagnosis not present

## 2022-04-23 DIAGNOSIS — Z6841 Body Mass Index (BMI) 40.0 and over, adult: Secondary | ICD-10-CM | POA: Diagnosis not present

## 2022-04-29 DIAGNOSIS — C44519 Basal cell carcinoma of skin of other part of trunk: Secondary | ICD-10-CM | POA: Diagnosis not present

## 2022-04-29 DIAGNOSIS — D225 Melanocytic nevi of trunk: Secondary | ICD-10-CM | POA: Diagnosis not present

## 2022-04-29 DIAGNOSIS — L821 Other seborrheic keratosis: Secondary | ICD-10-CM | POA: Diagnosis not present

## 2022-04-29 DIAGNOSIS — D485 Neoplasm of uncertain behavior of skin: Secondary | ICD-10-CM | POA: Diagnosis not present

## 2022-04-29 DIAGNOSIS — L570 Actinic keratosis: Secondary | ICD-10-CM | POA: Diagnosis not present

## 2022-05-02 DIAGNOSIS — C44519 Basal cell carcinoma of skin of other part of trunk: Secondary | ICD-10-CM | POA: Diagnosis not present

## 2022-07-29 DIAGNOSIS — E559 Vitamin D deficiency, unspecified: Secondary | ICD-10-CM | POA: Diagnosis not present

## 2022-07-29 DIAGNOSIS — E119 Type 2 diabetes mellitus without complications: Secondary | ICD-10-CM | POA: Diagnosis not present

## 2022-07-29 DIAGNOSIS — Z794 Long term (current) use of insulin: Secondary | ICD-10-CM | POA: Diagnosis not present

## 2022-07-31 DIAGNOSIS — I10 Essential (primary) hypertension: Secondary | ICD-10-CM | POA: Diagnosis not present

## 2022-07-31 DIAGNOSIS — E538 Deficiency of other specified B group vitamins: Secondary | ICD-10-CM | POA: Diagnosis not present

## 2022-07-31 DIAGNOSIS — E119 Type 2 diabetes mellitus without complications: Secondary | ICD-10-CM | POA: Diagnosis not present

## 2022-07-31 DIAGNOSIS — Z794 Long term (current) use of insulin: Secondary | ICD-10-CM | POA: Diagnosis not present

## 2022-07-31 DIAGNOSIS — F4323 Adjustment disorder with mixed anxiety and depressed mood: Secondary | ICD-10-CM | POA: Diagnosis not present

## 2022-07-31 DIAGNOSIS — E559 Vitamin D deficiency, unspecified: Secondary | ICD-10-CM | POA: Diagnosis not present

## 2022-07-31 DIAGNOSIS — E782 Mixed hyperlipidemia: Secondary | ICD-10-CM | POA: Diagnosis not present

## 2022-07-31 DIAGNOSIS — Z6841 Body Mass Index (BMI) 40.0 and over, adult: Secondary | ICD-10-CM | POA: Diagnosis not present

## 2022-08-28 DIAGNOSIS — E119 Type 2 diabetes mellitus without complications: Secondary | ICD-10-CM | POA: Diagnosis not present

## 2022-08-28 DIAGNOSIS — Z794 Long term (current) use of insulin: Secondary | ICD-10-CM | POA: Diagnosis not present

## 2022-08-28 DIAGNOSIS — E782 Mixed hyperlipidemia: Secondary | ICD-10-CM | POA: Diagnosis not present

## 2022-08-28 DIAGNOSIS — Z6841 Body Mass Index (BMI) 40.0 and over, adult: Secondary | ICD-10-CM | POA: Diagnosis not present

## 2022-08-28 DIAGNOSIS — R0602 Shortness of breath: Secondary | ICD-10-CM | POA: Diagnosis not present

## 2022-08-28 DIAGNOSIS — I447 Left bundle-branch block, unspecified: Secondary | ICD-10-CM | POA: Diagnosis not present

## 2022-08-28 DIAGNOSIS — I10 Essential (primary) hypertension: Secondary | ICD-10-CM | POA: Diagnosis not present

## 2022-10-04 DIAGNOSIS — Z794 Long term (current) use of insulin: Secondary | ICD-10-CM | POA: Diagnosis not present

## 2022-10-04 DIAGNOSIS — J069 Acute upper respiratory infection, unspecified: Secondary | ICD-10-CM | POA: Diagnosis not present

## 2022-10-04 DIAGNOSIS — E119 Type 2 diabetes mellitus without complications: Secondary | ICD-10-CM | POA: Diagnosis not present

## 2022-10-04 DIAGNOSIS — Z6841 Body Mass Index (BMI) 40.0 and over, adult: Secondary | ICD-10-CM | POA: Diagnosis not present

## 2023-02-14 DIAGNOSIS — E559 Vitamin D deficiency, unspecified: Secondary | ICD-10-CM | POA: Diagnosis not present

## 2023-02-14 DIAGNOSIS — E538 Deficiency of other specified B group vitamins: Secondary | ICD-10-CM | POA: Diagnosis not present

## 2023-02-14 DIAGNOSIS — Z794 Long term (current) use of insulin: Secondary | ICD-10-CM | POA: Diagnosis not present

## 2023-02-14 DIAGNOSIS — E782 Mixed hyperlipidemia: Secondary | ICD-10-CM | POA: Diagnosis not present

## 2023-02-14 DIAGNOSIS — I10 Essential (primary) hypertension: Secondary | ICD-10-CM | POA: Diagnosis not present

## 2023-02-14 DIAGNOSIS — E119 Type 2 diabetes mellitus without complications: Secondary | ICD-10-CM | POA: Diagnosis not present

## 2023-02-18 IMAGING — MG MM DIGITAL SCREENING BILAT W/ TOMO AND CAD
8 of 14 series · 8 of 40 positions shown · non-contrast
Comparison: Previous exam(s).

CLINICAL DATA: Screening.

EXAM:
DIGITAL SCREENING BILATERAL MAMMOGRAM WITH TOMOSYNTHESIS AND CAD
TECHNIQUE: Bilateral screening digital craniocaudal and mediolateral oblique
mammograms were obtained. Bilateral screening digital breast
tomosynthesis was performed. The images were evaluated with
computer-aided detection.

[R CV synth-2D]
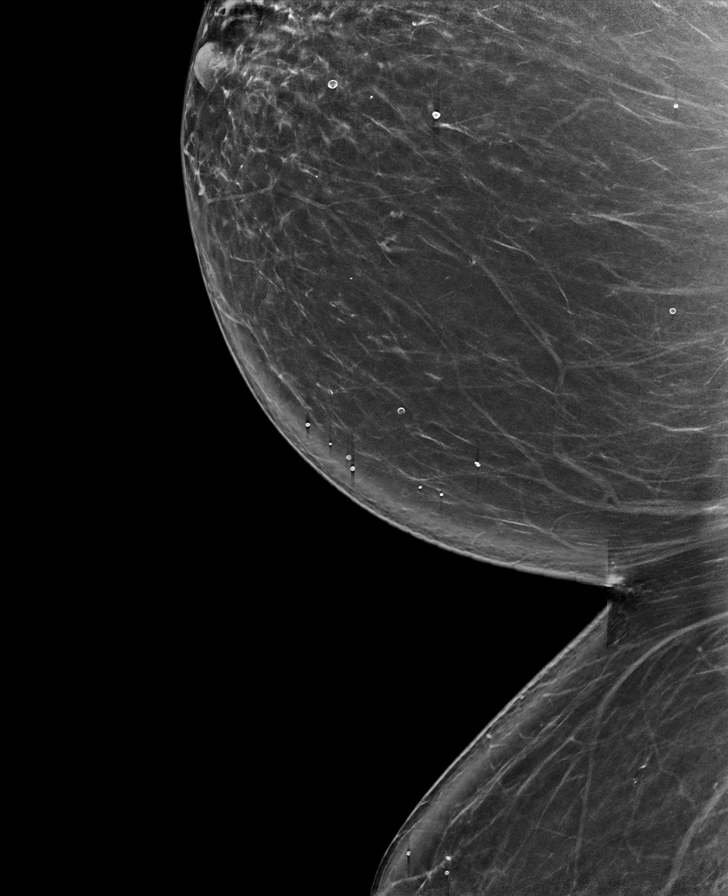

[R CC synth-2D]
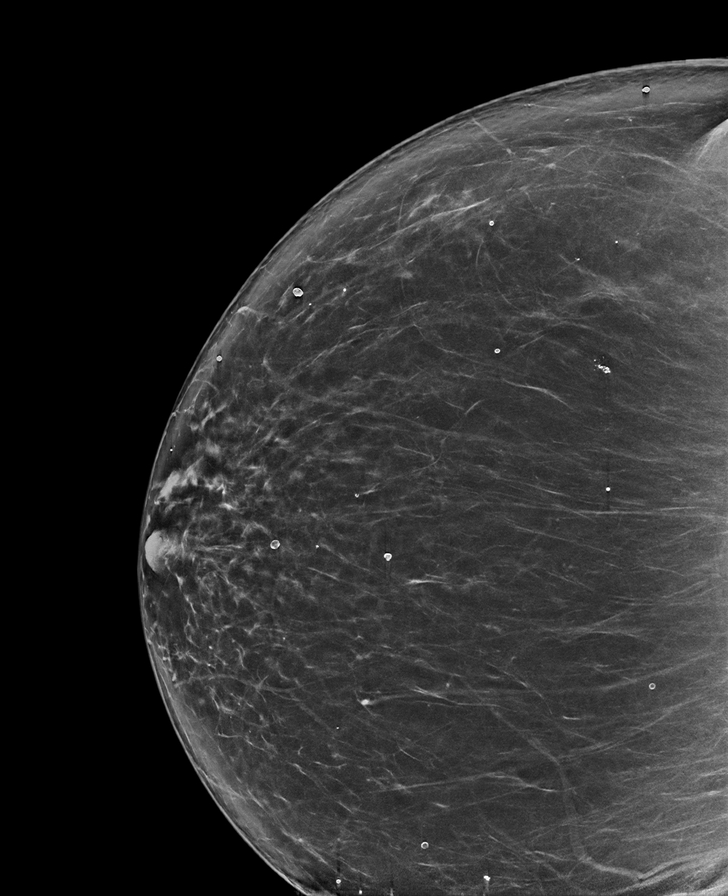

[R MLO synth-2D (1 of 2)]
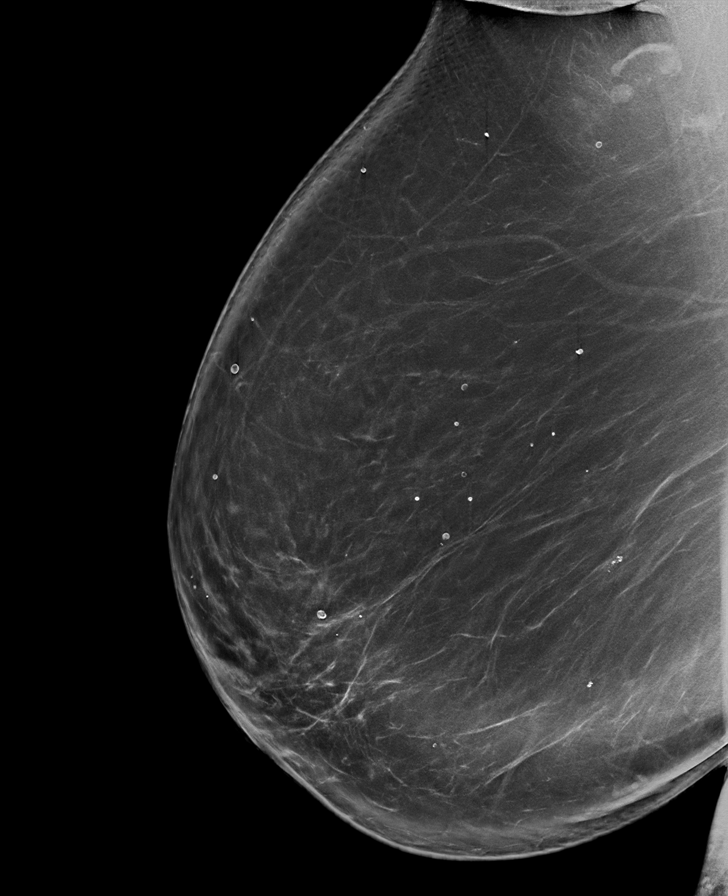

[L CC synth-2D]
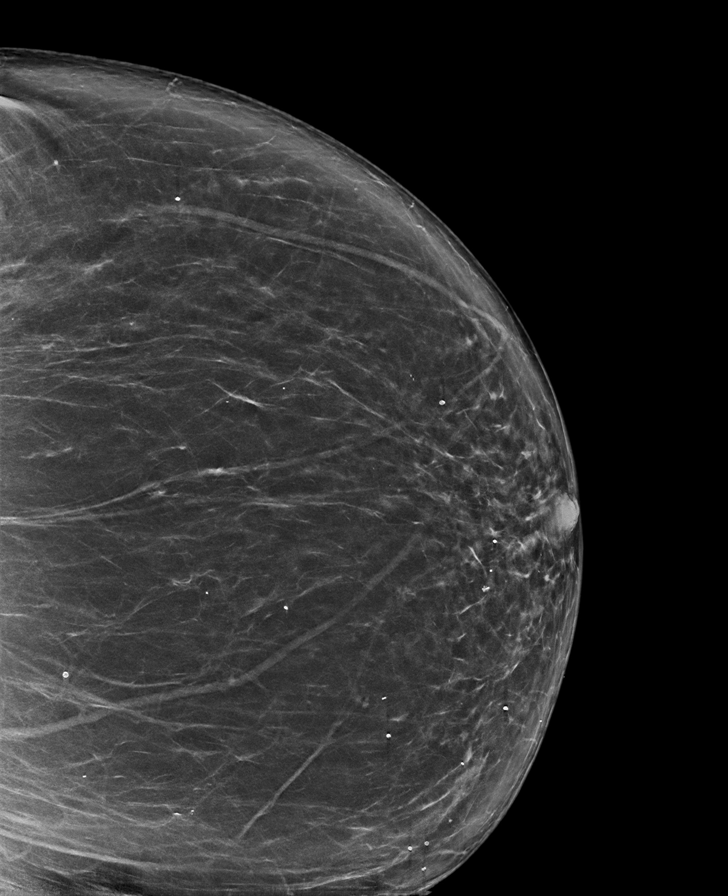

[R MLO synth-2D (2 of 2)]
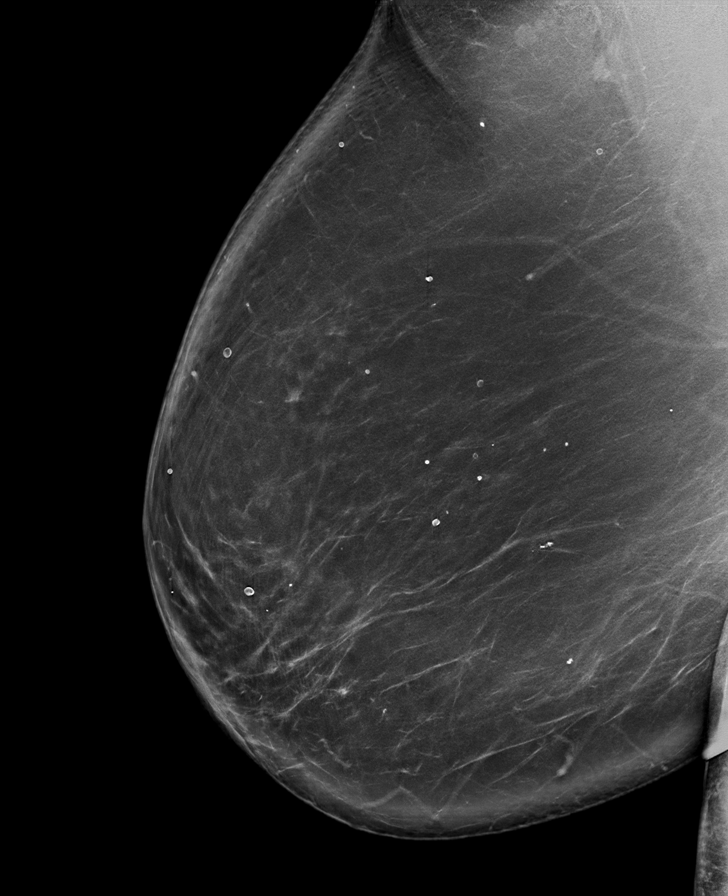

[L MLO synth-2D (1 of 2)]
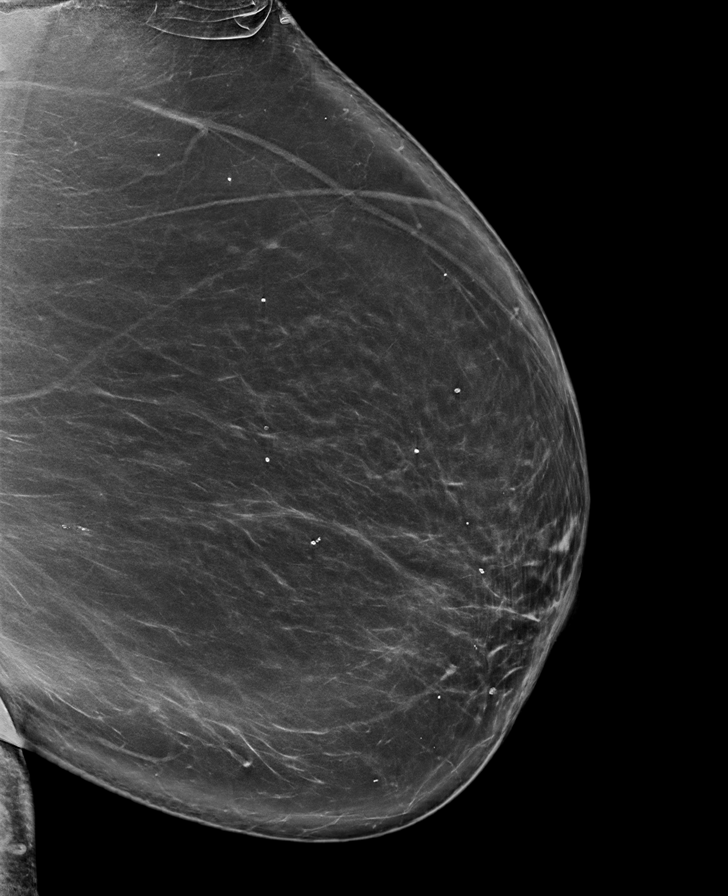

[L MLO synth-2D (2 of 2)]
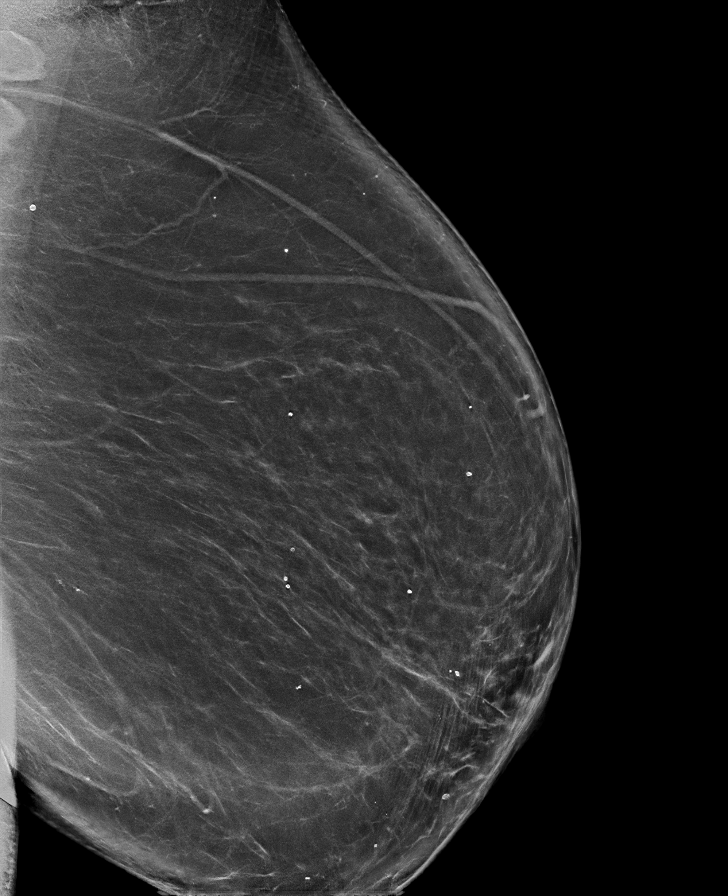

[L MLO tomo · tomo slice 53/106.0]
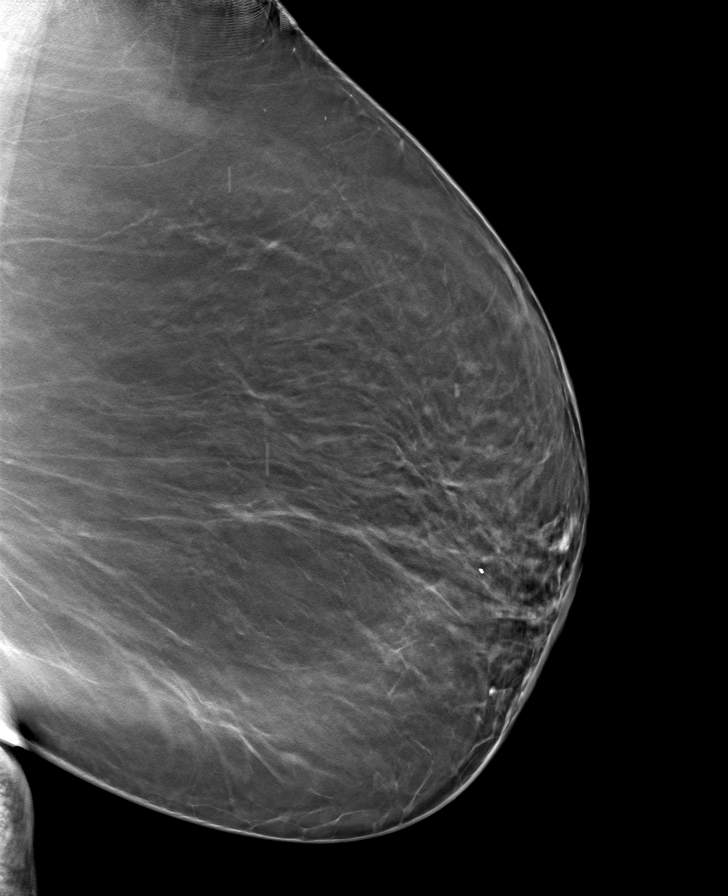

[8 of 40 positions shown; findings below may reference images not displayed]

ACR Breast Density Category b: There are scattered areas of
fibroglandular density.
FINDINGS: There are no findings suspicious for malignancy. The images were
evaluated with computer-aided detection.
IMPRESSION: No mammographic evidence of malignancy. A result letter of this
screening mammogram will be mailed directly to the patient.

RECOMMENDATION:
Screening mammogram in one year. (Code:[id number]-[id number])

BI-RADS CATEGORY  1: Negative.

## 2023-02-21 DIAGNOSIS — E785 Hyperlipidemia, unspecified: Secondary | ICD-10-CM | POA: Diagnosis not present

## 2023-02-21 DIAGNOSIS — Z794 Long term (current) use of insulin: Secondary | ICD-10-CM | POA: Diagnosis not present

## 2023-02-21 DIAGNOSIS — I10 Essential (primary) hypertension: Secondary | ICD-10-CM | POA: Diagnosis not present

## 2023-02-21 DIAGNOSIS — Z0000 Encounter for general adult medical examination without abnormal findings: Secondary | ICD-10-CM | POA: Diagnosis not present

## 2023-02-21 DIAGNOSIS — Z1331 Encounter for screening for depression: Secondary | ICD-10-CM | POA: Diagnosis not present

## 2023-02-21 DIAGNOSIS — E669 Obesity, unspecified: Secondary | ICD-10-CM | POA: Diagnosis not present

## 2023-02-21 DIAGNOSIS — E119 Type 2 diabetes mellitus without complications: Secondary | ICD-10-CM | POA: Diagnosis not present

## 2023-02-21 DIAGNOSIS — M8580 Other specified disorders of bone density and structure, unspecified site: Secondary | ICD-10-CM | POA: Diagnosis not present

## 2023-02-21 DIAGNOSIS — Z6839 Body mass index (BMI) 39.0-39.9, adult: Secondary | ICD-10-CM | POA: Diagnosis not present

## 2023-02-27 DIAGNOSIS — Z794 Long term (current) use of insulin: Secondary | ICD-10-CM | POA: Diagnosis not present

## 2023-02-27 DIAGNOSIS — I447 Left bundle-branch block, unspecified: Secondary | ICD-10-CM | POA: Diagnosis not present

## 2023-02-27 DIAGNOSIS — E782 Mixed hyperlipidemia: Secondary | ICD-10-CM | POA: Diagnosis not present

## 2023-02-27 DIAGNOSIS — R079 Chest pain, unspecified: Secondary | ICD-10-CM | POA: Diagnosis not present

## 2023-02-27 DIAGNOSIS — R0602 Shortness of breath: Secondary | ICD-10-CM | POA: Diagnosis not present

## 2023-02-27 DIAGNOSIS — E119 Type 2 diabetes mellitus without complications: Secondary | ICD-10-CM | POA: Diagnosis not present

## 2023-02-27 DIAGNOSIS — M8588 Other specified disorders of bone density and structure, other site: Secondary | ICD-10-CM | POA: Diagnosis not present

## 2023-02-27 DIAGNOSIS — I10 Essential (primary) hypertension: Secondary | ICD-10-CM | POA: Diagnosis not present

## 2023-08-19 DIAGNOSIS — I10 Essential (primary) hypertension: Secondary | ICD-10-CM | POA: Diagnosis not present

## 2023-08-19 DIAGNOSIS — I447 Left bundle-branch block, unspecified: Secondary | ICD-10-CM | POA: Diagnosis not present

## 2023-08-19 DIAGNOSIS — Z23 Encounter for immunization: Secondary | ICD-10-CM | POA: Diagnosis not present

## 2023-08-19 DIAGNOSIS — R0602 Shortness of breath: Secondary | ICD-10-CM | POA: Diagnosis not present

## 2023-08-19 DIAGNOSIS — E119 Type 2 diabetes mellitus without complications: Secondary | ICD-10-CM | POA: Diagnosis not present

## 2023-08-19 DIAGNOSIS — Z794 Long term (current) use of insulin: Secondary | ICD-10-CM | POA: Diagnosis not present

## 2023-08-19 DIAGNOSIS — E782 Mixed hyperlipidemia: Secondary | ICD-10-CM | POA: Diagnosis not present

## 2023-08-26 DIAGNOSIS — E119 Type 2 diabetes mellitus without complications: Secondary | ICD-10-CM | POA: Diagnosis not present

## 2023-08-26 DIAGNOSIS — Z794 Long term (current) use of insulin: Secondary | ICD-10-CM | POA: Diagnosis not present

## 2023-08-26 DIAGNOSIS — E559 Vitamin D deficiency, unspecified: Secondary | ICD-10-CM | POA: Diagnosis not present

## 2023-08-26 DIAGNOSIS — E782 Mixed hyperlipidemia: Secondary | ICD-10-CM | POA: Diagnosis not present

## 2023-08-26 DIAGNOSIS — I10 Essential (primary) hypertension: Secondary | ICD-10-CM | POA: Diagnosis not present

## 2023-08-26 DIAGNOSIS — Z6841 Body Mass Index (BMI) 40.0 and over, adult: Secondary | ICD-10-CM | POA: Diagnosis not present

## 2023-11-27 ENCOUNTER — Other Ambulatory Visit: Payer: Self-pay | Admitting: Family Medicine

## 2023-11-27 DIAGNOSIS — Z1231 Encounter for screening mammogram for malignant neoplasm of breast: Secondary | ICD-10-CM

## 2023-12-04 ENCOUNTER — Ambulatory Visit
Admission: RE | Admit: 2023-12-04 | Discharge: 2023-12-04 | Disposition: A | Discharge status: Home / Self Care | Payer: Medicare HMO | Source: Ambulatory Visit | Attending: Family Medicine | Admitting: Family Medicine

## 2023-12-04 DIAGNOSIS — Z1231 Encounter for screening mammogram for malignant neoplasm of breast: Secondary | ICD-10-CM | POA: Insufficient documentation

## 2024-02-18 DIAGNOSIS — E119 Type 2 diabetes mellitus without complications: Secondary | ICD-10-CM | POA: Diagnosis not present

## 2024-02-19 DIAGNOSIS — Z0100 Encounter for examination of eyes and vision without abnormal findings: Secondary | ICD-10-CM | POA: Diagnosis not present

## 2024-02-19 DIAGNOSIS — H2513 Age-related nuclear cataract, bilateral: Secondary | ICD-10-CM | POA: Diagnosis not present

## 2024-02-19 DIAGNOSIS — E119 Type 2 diabetes mellitus without complications: Secondary | ICD-10-CM | POA: Diagnosis not present

## 2024-02-19 DIAGNOSIS — M3501 Sicca syndrome with keratoconjunctivitis: Secondary | ICD-10-CM | POA: Diagnosis not present

## 2024-02-19 DIAGNOSIS — H40003 Preglaucoma, unspecified, bilateral: Secondary | ICD-10-CM | POA: Diagnosis not present

## 2024-02-24 DIAGNOSIS — Z0000 Encounter for general adult medical examination without abnormal findings: Secondary | ICD-10-CM | POA: Diagnosis not present

## 2024-02-24 DIAGNOSIS — Z1331 Encounter for screening for depression: Secondary | ICD-10-CM | POA: Diagnosis not present

## 2024-02-24 DIAGNOSIS — E119 Type 2 diabetes mellitus without complications: Secondary | ICD-10-CM | POA: Diagnosis not present

## 2024-02-24 DIAGNOSIS — E669 Obesity, unspecified: Secondary | ICD-10-CM | POA: Diagnosis not present

## 2024-02-24 DIAGNOSIS — Z6841 Body Mass Index (BMI) 40.0 and over, adult: Secondary | ICD-10-CM | POA: Diagnosis not present

## 2024-02-24 DIAGNOSIS — I10 Essential (primary) hypertension: Secondary | ICD-10-CM | POA: Diagnosis not present

## 2024-02-24 DIAGNOSIS — E785 Hyperlipidemia, unspecified: Secondary | ICD-10-CM | POA: Diagnosis not present

## 2024-02-24 DIAGNOSIS — M8580 Other specified disorders of bone density and structure, unspecified site: Secondary | ICD-10-CM | POA: Diagnosis not present

## 2024-02-24 DIAGNOSIS — Z794 Long term (current) use of insulin: Secondary | ICD-10-CM | POA: Diagnosis not present

## 2024-03-01 ENCOUNTER — Encounter: Payer: Self-pay | Admitting: Ophthalmology

## 2024-03-03 DIAGNOSIS — R0602 Shortness of breath: Secondary | ICD-10-CM | POA: Diagnosis not present

## 2024-03-03 DIAGNOSIS — E782 Mixed hyperlipidemia: Secondary | ICD-10-CM | POA: Diagnosis not present

## 2024-03-03 DIAGNOSIS — E119 Type 2 diabetes mellitus without complications: Secondary | ICD-10-CM | POA: Diagnosis not present

## 2024-03-03 DIAGNOSIS — Z6841 Body Mass Index (BMI) 40.0 and over, adult: Secondary | ICD-10-CM | POA: Diagnosis not present

## 2024-03-03 DIAGNOSIS — I10 Essential (primary) hypertension: Secondary | ICD-10-CM | POA: Diagnosis not present

## 2024-03-03 DIAGNOSIS — Z794 Long term (current) use of insulin: Secondary | ICD-10-CM | POA: Diagnosis not present

## 2024-03-03 DIAGNOSIS — I447 Left bundle-branch block, unspecified: Secondary | ICD-10-CM | POA: Diagnosis not present

## 2024-03-10 DIAGNOSIS — H2511 Age-related nuclear cataract, right eye: Secondary | ICD-10-CM | POA: Diagnosis not present

## 2024-03-12 NOTE — Discharge Instructions (Signed)

## 2024-03-16 ENCOUNTER — Encounter
Admission: RE | Disposition: A | Discharge status: Home / Self Care | Payer: Self-pay | Source: Home / Self Care | Attending: Ophthalmology

## 2024-03-16 ENCOUNTER — Ambulatory Visit
Admission: RE | Admit: 2024-03-16 | Discharge: 2024-03-16 | Disposition: A | Discharge status: Home / Self Care | Attending: Ophthalmology | Admitting: Ophthalmology

## 2024-03-16 ENCOUNTER — Other Ambulatory Visit: Payer: Self-pay

## 2024-03-16 ENCOUNTER — Encounter: Payer: Self-pay | Admitting: Ophthalmology

## 2024-03-16 ENCOUNTER — Ambulatory Visit: Payer: Self-pay | Admitting: Anesthesiology

## 2024-03-16 DIAGNOSIS — H2511 Age-related nuclear cataract, right eye: Secondary | ICD-10-CM | POA: Diagnosis not present

## 2024-03-16 DIAGNOSIS — Z7984 Long term (current) use of oral hypoglycemic drugs: Secondary | ICD-10-CM | POA: Diagnosis not present

## 2024-03-16 DIAGNOSIS — Z6841 Body Mass Index (BMI) 40.0 and over, adult: Secondary | ICD-10-CM | POA: Insufficient documentation

## 2024-03-16 DIAGNOSIS — K219 Gastro-esophageal reflux disease without esophagitis: Secondary | ICD-10-CM | POA: Diagnosis not present

## 2024-03-16 DIAGNOSIS — E1136 Type 2 diabetes mellitus with diabetic cataract: Secondary | ICD-10-CM | POA: Insufficient documentation

## 2024-03-16 DIAGNOSIS — I10 Essential (primary) hypertension: Secondary | ICD-10-CM | POA: Diagnosis not present

## 2024-03-16 DIAGNOSIS — E66813 Obesity, class 3: Secondary | ICD-10-CM | POA: Insufficient documentation

## 2024-03-16 DIAGNOSIS — Z794 Long term (current) use of insulin: Secondary | ICD-10-CM | POA: Insufficient documentation

## 2024-03-16 HISTORY — DX: Unspecified osteoarthritis, unspecified site: M19.90

## 2024-03-16 HISTORY — DX: Depression, unspecified: F32.A

## 2024-03-16 HISTORY — PX: CATARACT EXTRACTION W/PHACO: SHX586

## 2024-03-16 HISTORY — DX: Gastro-esophageal reflux disease without esophagitis: K21.9

## 2024-03-16 LAB — GLUCOSE, CAPILLARY: Glucose-Capillary: 202 mg/dL — ABNORMAL HIGH (ref 70–99)

## 2024-03-16 SURGERY — PHACOEMULSIFICATION, CATARACT, WITH IOL INSERTION
Anesthesia: Monitor Anesthesia Care | Site: Eye | Laterality: Right

## 2024-03-16 MED ORDER — FENTANYL CITRATE (PF) 100 MCG/2ML IJ SOLN
INTRAMUSCULAR | Status: DC | PRN
  Administered 2024-03-16 (×2): 50 ug via INTRAVENOUS

## 2024-03-16 MED ORDER — FENTANYL CITRATE (PF) 100 MCG/2ML IJ SOLN
INTRAMUSCULAR | Status: AC
Start: 2024-03-16 — End: 2024-03-16
  Filled 2024-03-16: qty 2

## 2024-03-16 MED ORDER — TETRACAINE HCL 0.5 % OP SOLN
OPHTHALMIC | Status: AC
  Filled 2024-03-16: qty 4

## 2024-03-16 MED ORDER — SIGHTPATH DOSE#1 BSS IO SOLN
INTRAOCULAR | Status: DC | PRN
  Administered 2024-03-16: 15 mL via INTRAOCULAR

## 2024-03-16 MED ORDER — BRIMONIDINE TARTRATE-TIMOLOL 0.2-0.5 % OP SOLN
OPHTHALMIC | Status: DC | PRN
  Administered 2024-03-16: 1 [drp] via OPHTHALMIC

## 2024-03-16 MED ORDER — LIDOCAINE HCL (PF) 2 % IJ SOLN
INTRAOCULAR | Status: DC | PRN
  Administered 2024-03-16: 2 mL

## 2024-03-16 MED ORDER — ARMC OPHTHALMIC DILATING DROPS
1.0000 | OPHTHALMIC | Status: DC | PRN
  Administered 2024-03-16 (×3): 1 via OPHTHALMIC

## 2024-03-16 MED ORDER — SIGHTPATH DOSE#1 NA CHONDROIT SULF-NA HYALURON 40-17 MG/ML IO SOLN
INTRAOCULAR | Status: DC | PRN
  Administered 2024-03-16: 1 mL via INTRAOCULAR

## 2024-03-16 MED ORDER — MIDAZOLAM HCL 2 MG/2ML IJ SOLN
INTRAMUSCULAR | Status: DC | PRN
  Administered 2024-03-16 (×2): 1 mg via INTRAVENOUS

## 2024-03-16 MED ORDER — MOXIFLOXACIN HCL 0.5 % OP SOLN
OPHTHALMIC | Status: DC | PRN
  Administered 2024-03-16: .2 mL via OPHTHALMIC

## 2024-03-16 MED ORDER — SIGHTPATH DOSE#1 BSS IO SOLN
INTRAOCULAR | Status: DC | PRN
  Administered 2024-03-16: 59 mL via OPHTHALMIC

## 2024-03-16 MED ORDER — MIDAZOLAM HCL 2 MG/2ML IJ SOLN
INTRAMUSCULAR | Status: AC
  Filled 2024-03-16: qty 2

## 2024-03-16 MED ORDER — ARMC OPHTHALMIC DILATING DROPS
OPHTHALMIC | Status: AC
Start: 2024-03-16 — End: 2024-03-16
  Filled 2024-03-16: qty 0.5

## 2024-03-16 MED ORDER — TETRACAINE HCL 0.5 % OP SOLN
1.0000 [drp] | OPHTHALMIC | Status: DC | PRN
  Administered 2024-03-16 (×3): 1 [drp] via OPHTHALMIC

## 2024-03-16 SURGICAL SUPPLY — 12 items
CATARACT SUITE SIGHTPATH (MISCELLANEOUS) ×1 IMPLANT
CYSTOTOME ANGL RVRS SHRT 25G (CUTTER) ×1 IMPLANT
CYSTOTOME ANGL RVRS SHRT 25GA (CUTTER) ×1 IMPLANT
FEE CATARACT SUITE SIGHTPATH (MISCELLANEOUS) ×1 IMPLANT
GLOVE BIOGEL PI IND STRL 8 (GLOVE) ×1 IMPLANT
GLOVE SURG LX STRL 8.0 MICRO (GLOVE) ×1 IMPLANT
GLOVE SURG PROTEXIS BL SZ6.5 (GLOVE) ×1 IMPLANT
GLOVE SURG SYN 6.5 PF PI BL (GLOVE) ×1 IMPLANT
LENS IOL TECNIS EYHANCE 18.0 (Intraocular Lens) IMPLANT
NDL FILTER BLUNT 18X1 1/2 (NEEDLE) ×1 IMPLANT
NEEDLE FILTER BLUNT 18X1 1/2 (NEEDLE) ×1 IMPLANT
SYR 3ML LL SCALE MARK (SYRINGE) ×1 IMPLANT

## 2024-03-16 NOTE — Transfer of Care (Signed)
 Immediate Anesthesia Transfer of Care Note  Patient: Amy Keith  Procedure(s) Performed: PHACOEMULSIFICATION, CATARACT, WITH IOL INSERTION 7.42 00:50.9 (Right: Eye)  Patient Location: PACU  Anesthesia Type: MAC  Level of Consciousness: awake, alert  and patient cooperative  Airway and Oxygen Therapy: Patient Spontanous Breathing and Patient connected to supplemental oxygen  Post-op Assessment: Post-op Vital signs reviewed, Patient's Cardiovascular Status Stable, Respiratory Function Stable, Patent Airway and No signs of Nausea or vomiting  Post-op Vital Signs: Reviewed and stable  Complications: No notable events documented.

## 2024-03-16 NOTE — H&P (Signed)
 [facility]   Primary Care Physician:  Rory Collard, MD Ophthalmologist: Dr. Jeb Miner  Pre-Procedure History & Physical: HPI:  Amy Keith is a 73 y.o. female here for cataract surgery.   Past Medical History:  Diagnosis Date   Allergic rhinitis    Arthritis    Depression    Diabetes mellitus without complication ([facility])    GERD (gastroesophageal reflux disease)    Hyperlipidemia    Hypertension    Seasonal allergies     Past Surgical History:  Procedure Laterality Date   COLONOSCOPY WITH PROPOFOL  N/A 08/11/2015   Procedure: COLONOSCOPY WITH PROPOFOL ;  Surgeon: Stephens Eis, MD;  Location: [facility] ENDOSCOPY;  Service: Gastroenterology;  Laterality: N/A;   DILATION AND CURETTAGE OF UTERUS     ENDOMETRIAL ABLATION W/ NOVASURE      Prior to Admission medications   Medication Sig Start Date End Date Taking? Authorizing Provider  amitriptyline (ELAVIL) 50 MG tablet Take 50 mg by mouth at bedtime.   Yes [provider]  atorvastatin (LIPITOR) 20 MG tablet Take 20 mg by mouth daily.   Yes [provider]  bisoprolol-hydrochlorothiazide (ZIAC) 10-6.25 MG tablet Take 1 tablet by mouth daily.   Yes [provider]  glipiZIDE (GLUCOTROL) 5 MG tablet Take by mouth daily before breakfast.   Yes [provider]  insulin glargine (LANTUS) 100 UNIT/ML injection Inject 60 Units into the skin daily after supper. 05/16/17  Yes [provider]  Loratadine 10 MG CAPS Take by mouth.   Yes [provider]  losartan (COZAAR) 100 MG tablet TAKE ONE TABLET BY MOUTH ONCE DAILY 01/07/17  Yes [provider]  metFORMIN (GLUCOPHAGE) 500 MG tablet Take 1,000 mg by mouth 2 (two) times daily with a meal.   Yes [provider]  omeprazole (PRILOSEC) 20 MG capsule Take 20 mg by mouth daily.   Yes [provider]    Allergies as of 02/24/2024   (No Known Allergies)    Family History  Problem Relation Age of Onset   Breast  cancer Neg Hx     Social History   Socioeconomic History   Marital status: Married    Spouse name: Not on file   Number of children: Not on file   Years of education: Not on file   Highest education level: Not on file  Occupational History   Not on file  Tobacco Use   Smoking status: Never   Smokeless tobacco: Never  Substance and Sexual Activity   Alcohol use: No   Drug use: No   Sexual activity: Not on file  Other Topics Concern   Not on file  Social History Narrative   Not on file   Social Drivers of Health   Financial Resource Strain: Low Risk  (02/21/2024)   Received from [facility]   Overall Financial Resource Strain (CARDIA)    Difficulty of Paying Living Expenses: Not very hard  Food Insecurity: No Food Insecurity (02/21/2024)   Received from [facility]   Hunger Vital Sign    Within the past 12 months, you worried that your food would run out before you got the money to buy more.: Never true    Within the past 12 months, the food you bought just didn't last and you didn't have money to get more.: Never true  Transportation Needs: No Transportation Needs (02/21/2024)   Received from [facility]   [facility] - Transportation  In the past 12 months, has lack of transportation kept you from medical appointments or from getting medications?: No    Lack of Transportation (Non-Medical): No  Physical Activity: Not on file  Stress: Not on file  Social Connections: Not on file  Intimate Partner Violence: Not on file    Review of Systems: See HPI, otherwise negative ROS  Physical Exam: BP (!) 155/69   Pulse 79   Temp (!) 97 F (36.1 C) (Temporal)   Resp 18   Ht 5' 1 (1.549 m)   Wt 99.8 kg   SpO2 96%   BMI 41.57 kg/m  General:   Alert, cooperative. Head:  Normocephalic and atraumatic. Respiratory:  Normal work of breathing. Cardiovascular:  NAD  Impression/Plan: Amy Keith is here for cataract  surgery.  Risks, benefits, limitations, and alternatives regarding cataract surgery have been reviewed with the patient.  Questions have been answered.  All parties agreeable.   Clair Crews, MD  03/16/2024, 8:35 AM

## 2024-03-16 NOTE — Op Note (Signed)
 PREOPERATIVE DIAGNOSIS:  Nuclear sclerotic cataract of the right eye.   POSTOPERATIVE DIAGNOSIS:  Right Eye Cataract   OPERATIVE PROCEDURE:ORPROCALL@   SURGEON:  Amy Crews, MD.   ANESTHESIA:  Anesthesiologist: Nancey Awkward, MD CRNA: Wilhelmena Hanson, CRNA  1.      Managed anesthesia care. 2.      0.67ml of Shugarcaine was instilled in the eye following the paracentesis.   COMPLICATIONS:  None.   TECHNIQUE:   Stop and chop   DESCRIPTION OF PROCEDURE:  The patient was examined and consented in the preoperative holding area where the aforementioned topical anesthesia was applied to the right eye and then brought back to the Operating Room where the right eye was prepped and draped in the usual sterile ophthalmic fashion and a lid speculum was placed. A paracentesis was created with the side port blade and the anterior chamber was filled with viscoelastic. A near clear corneal incision was performed with the steel keratome. A continuous curvilinear capsulorrhexis was performed with a cystotome followed by the capsulorrhexis forceps. Hydrodissection and hydrodelineation were carried out with BSS on a blunt cannula. The lens was removed in a stop and chop  technique and the remaining cortical material was removed with the irrigation-aspiration handpiece. The capsular bag was inflated with viscoelastic and the Technis ZCB00  lens was placed in the capsular bag without complication. The remaining viscoelastic was removed from the eye with the irrigation-aspiration handpiece. The wounds were hydrated. The anterior chamber was flushed with BSS and the eye was inflated to physiologic pressure. 0.1ml of Vigamox was placed in the anterior chamber. The wounds were found to be water tight. The eye was dressed with Combigan. The patient was given protective glasses to wear throughout the day and a shield with which to sleep tonight. The patient was also given drops with which to begin a drop regimen today  and will follow-up with me in one day. Implant Name Type Inv. Item Serial No. Manufacturer Lot No. LRB No. Used Action  LENS IOL TECNIS EYHANCE 18.0 - [id number] Intraocular Lens LENS IOL TECNIS EYHANCE 18.0 [id number] [employer]  Right 1 Implanted   Procedure(s): PHACOEMULSIFICATION, CATARACT, WITH IOL INSERTION 7.42 00:50.9 (Right)  Electronically signed: Clair Keith 03/16/2024 9:08 AM

## 2024-03-16 NOTE — Anesthesia Preprocedure Evaluation (Signed)
 Anesthesia Evaluation  Patient identified by MRN, date of birth, ID band Patient awake    Reviewed: Allergy & Precautions, NPO status , Patient's Chart, lab work & pertinent test results  History of Anesthesia Complications Negative for: history of anesthetic complications  Airway Mallampati: III  TM Distance: >3 FB Neck ROM: full    Dental no notable dental hx.    Pulmonary neg pulmonary ROS   Pulmonary exam normal        Cardiovascular hypertension, On Medications negative cardio ROS Normal cardiovascular exam     Neuro/Psych  PSYCHIATRIC DISORDERS  Depression    negative neurological ROS     GI/Hepatic Neg liver ROS,GERD  Medicated,,  Endo/Other  diabetes  Class 3 obesity  Renal/GU   negative genitourinary   Musculoskeletal  (+) Arthritis ,    Abdominal   Peds  Hematology negative hematology ROS (+)   Anesthesia Other Findings Past Medical History: No date: Allergic rhinitis No date: Arthritis No date: Depression No date: Diabetes mellitus without complication (HCC) No date: GERD (gastroesophageal reflux disease) No date: Hyperlipidemia No date: Hypertension No date: Seasonal allergies  Past Surgical History: 08/11/2015: COLONOSCOPY WITH PROPOFOL ; N/A     Comment:  Procedure: COLONOSCOPY WITH PROPOFOL ;  Surgeon: Stephens Eis, MD;  Location: [facility] ENDOSCOPY;  Service:               Gastroenterology;  Laterality: N/A; No date: DILATION AND CURETTAGE OF UTERUS No date: ENDOMETRIAL ABLATION W/ NOVASURE  BMI    Body Mass Index: 41.19 kg/m      Reproductive/Obstetrics negative OB ROS                             Anesthesia Physical Anesthesia Plan  ASA: 3  Anesthesia Plan: MAC   Post-op Pain Management: Minimal or no pain anticipated   Induction: Intravenous  PONV Risk Score and Plan:   Airway Management Planned: Natural Airway and Nasal  Cannula  Additional Equipment:   Intra-op Plan:   Post-operative Plan:   Informed Consent: I have reviewed the patients History and Physical, chart, labs and discussed the procedure including the risks, benefits and alternatives for the proposed anesthesia with the patient or authorized representative who has indicated his/her understanding and acceptance.     Dental Advisory Given  Plan Discussed with: Anesthesiologist, CRNA and Surgeon  Anesthesia Plan Comments: (Patient consented for risks of anesthesia including but not limited to:  - adverse reactions to medications - damage to eyes, teeth, lips or other oral mucosa - nerve damage due to positioning  - sore throat or hoarseness - Damage to heart, brain, nerves, lungs, other parts of body or loss of life  Patient voiced understanding and assent.)       Anesthesia Quick Evaluation

## 2024-03-16 NOTE — Anesthesia Postprocedure Evaluation (Signed)
 Anesthesia Post Note  Patient: Amy Keith  Procedure(s) Performed: PHACOEMULSIFICATION, CATARACT, WITH IOL INSERTION 7.42 00:50.9 (Right: Eye)  Patient location during evaluation: PACU Anesthesia Type: MAC Level of consciousness: awake and alert Pain management: pain level controlled Vital Signs Assessment: post-procedure vital signs reviewed and stable Respiratory status: spontaneous breathing, nonlabored ventilation, respiratory function stable and patient connected to nasal cannula oxygen Cardiovascular status: stable and blood pressure returned to baseline Postop Assessment: no apparent nausea or vomiting Anesthetic complications: no   No notable events documented.   Last Vitals:  Vitals:   03/16/24 0909 03/16/24 0912  BP: 116/67 112/64  Pulse: 81 81  Resp: 14 14  Temp: 36.4 C 36.4 C  SpO2: 94% 94%    Last Pain:  Vitals:   03/16/24 0912  TempSrc:   PainSc: 0-No pain                 Nancey Awkward

## 2024-03-17 ENCOUNTER — Encounter: Payer: Self-pay | Admitting: Ophthalmology

## 2024-03-17 DIAGNOSIS — H2512 Age-related nuclear cataract, left eye: Secondary | ICD-10-CM | POA: Diagnosis not present

## 2024-03-26 NOTE — Discharge Instructions (Signed)

## 2024-03-30 ENCOUNTER — Encounter: Payer: Self-pay | Admitting: Ophthalmology

## 2024-03-30 ENCOUNTER — Ambulatory Visit
Admission: RE | Admit: 2024-03-30 | Discharge: 2024-03-30 | Disposition: A | Discharge status: Home / Self Care | Attending: Ophthalmology | Admitting: Ophthalmology

## 2024-03-30 ENCOUNTER — Ambulatory Visit: Payer: Self-pay | Admitting: Anesthesiology

## 2024-03-30 ENCOUNTER — Encounter
Admission: RE | Disposition: A | Discharge status: Home / Self Care | Payer: Self-pay | Source: Home / Self Care | Attending: Ophthalmology

## 2024-03-30 ENCOUNTER — Other Ambulatory Visit: Payer: Self-pay

## 2024-03-30 DIAGNOSIS — I10 Essential (primary) hypertension: Secondary | ICD-10-CM | POA: Diagnosis not present

## 2024-03-30 DIAGNOSIS — Z7984 Long term (current) use of oral hypoglycemic drugs: Secondary | ICD-10-CM | POA: Insufficient documentation

## 2024-03-30 DIAGNOSIS — K219 Gastro-esophageal reflux disease without esophagitis: Secondary | ICD-10-CM | POA: Diagnosis not present

## 2024-03-30 DIAGNOSIS — H2512 Age-related nuclear cataract, left eye: Secondary | ICD-10-CM | POA: Diagnosis not present

## 2024-03-30 DIAGNOSIS — E1136 Type 2 diabetes mellitus with diabetic cataract: Secondary | ICD-10-CM | POA: Insufficient documentation

## 2024-03-30 DIAGNOSIS — Z794 Long term (current) use of insulin: Secondary | ICD-10-CM | POA: Diagnosis not present

## 2024-03-30 HISTORY — PX: CATARACT EXTRACTION W/PHACO: SHX586

## 2024-03-30 LAB — GLUCOSE, CAPILLARY: Glucose-Capillary: 170 mg/dL — ABNORMAL HIGH (ref 70–99)

## 2024-03-30 SURGERY — PHACOEMULSIFICATION, CATARACT, WITH IOL INSERTION
Anesthesia: Monitor Anesthesia Care | Site: Eye | Laterality: Left

## 2024-03-30 MED ORDER — TETRACAINE HCL 0.5 % OP SOLN
1.0000 [drp] | OPHTHALMIC | Status: DC | PRN
  Administered 2024-03-30 (×3): 1 [drp] via OPHTHALMIC

## 2024-03-30 MED ORDER — MOXIFLOXACIN HCL 0.5 % OP SOLN
OPHTHALMIC | Status: DC | PRN
  Administered 2024-03-30: .2 mL via OPHTHALMIC

## 2024-03-30 MED ORDER — LACTATED RINGERS IV SOLN: INTRAVENOUS | Status: DC

## 2024-03-30 MED ORDER — FENTANYL CITRATE (PF) 100 MCG/2ML IJ SOLN
INTRAMUSCULAR | Status: DC | PRN
  Administered 2024-03-30: 50 ug via INTRAVENOUS

## 2024-03-30 MED ORDER — SIGHTPATH DOSE#1 NA CHONDROIT SULF-NA HYALURON 40-17 MG/ML IO SOLN
INTRAOCULAR | Status: DC | PRN
  Administered 2024-03-30: 1 mL via INTRAOCULAR

## 2024-03-30 MED ORDER — BRIMONIDINE TARTRATE-TIMOLOL 0.2-0.5 % OP SOLN
OPHTHALMIC | Status: DC | PRN
  Administered 2024-03-30: 1 [drp] via OPHTHALMIC

## 2024-03-30 MED ORDER — LIDOCAINE HCL (PF) 2 % IJ SOLN
INTRAOCULAR | Status: DC | PRN
  Administered 2024-03-30: 1 mL via INTRAMUSCULAR

## 2024-03-30 MED ORDER — MIDAZOLAM HCL 5 MG/5ML IJ SOLN
INTRAMUSCULAR | Status: DC | PRN
  Administered 2024-03-30: 1 mg via INTRAVENOUS

## 2024-03-30 MED ORDER — ARMC OPHTHALMIC DILATING DROPS
1.0000 | OPHTHALMIC | Status: DC | PRN
  Administered 2024-03-30 (×3): 1 via OPHTHALMIC

## 2024-03-30 MED ORDER — MIDAZOLAM HCL 2 MG/2ML IJ SOLN
INTRAMUSCULAR | Status: AC
Start: 2024-03-30 — End: 2024-03-30
  Filled 2024-03-30: qty 2

## 2024-03-30 MED ORDER — EPINEPHRINE PF 1 MG/ML IJ SOLN
INTRAMUSCULAR | Status: DC | PRN
  Administered 2024-03-30: 74 mL via OPHTHALMIC

## 2024-03-30 MED ORDER — SIGHTPATH DOSE#1 BSS IO SOLN
INTRAOCULAR | Status: DC | PRN
Start: 2024-03-30 — End: 2024-03-30
  Administered 2024-03-30: 15 mL

## 2024-03-30 MED ORDER — FENTANYL CITRATE (PF) 100 MCG/2ML IJ SOLN
INTRAMUSCULAR | Status: AC
  Filled 2024-03-30: qty 2

## 2024-03-30 MED ORDER — ARMC OPHTHALMIC DILATING DROPS
OPHTHALMIC | Status: AC
  Filled 2024-03-30: qty 0.5

## 2024-03-30 MED ORDER — TETRACAINE HCL 0.5 % OP SOLN
OPHTHALMIC | Status: AC
Start: 2024-03-30 — End: 2024-03-30
  Filled 2024-03-30: qty 4

## 2024-03-30 SURGICAL SUPPLY — 9 items
CATARACT SUITE SIGHTPATH (MISCELLANEOUS) ×1 IMPLANT
CYSTOTOME ANGL RVRS SHRT 25G (CUTTER) ×1 IMPLANT
CYSTOTOME ANGL RVRS SHRT 25GA (CUTTER) ×1 IMPLANT
FEE CATARACT SUITE SIGHTPATH (MISCELLANEOUS) ×1 IMPLANT
GLOVE BIOGEL PI IND STRL 8 (GLOVE) ×1 IMPLANT
GLOVE SURG LX STRL 8.0 MICRO (GLOVE) ×1 IMPLANT
GLOVE SURG PROTEXIS BL SZ6.5 (GLOVE) ×1 IMPLANT
GLOVE SURG SYN 6.5 PF PI BL (GLOVE) ×1 IMPLANT
LENS IOL TECNIS EYHANCE 19.0 (Intraocular Lens) IMPLANT

## 2024-03-30 NOTE — H&P (Signed)
 [facility]   Primary Care Physician:  Glover Lenis, MD Ophthalmologist: Dr. Jaye  Pre-Procedure History & Physical: HPI:  Amy Keith is a 73 y.o. female here for cataract surgery.   Past Medical History:  Diagnosis Date   Allergic rhinitis    Arthritis    Depression    Diabetes mellitus without complication (HCC)    GERD (gastroesophageal reflux disease)    Hyperlipidemia    Hypertension    Seasonal allergies     Past Surgical History:  Procedure Laterality Date   CATARACT EXTRACTION W/PHACO Right 03/16/2024   Procedure: PHACOEMULSIFICATION, CATARACT, WITH IOL INSERTION 7.42 00:50.9;  Surgeon: Jaye Fallow, MD;  Location: [facility];  Service: Ophthalmology;  Laterality: Right;   COLONOSCOPY WITH PROPOFOL  N/A 08/11/2015   Procedure: COLONOSCOPY WITH PROPOFOL ;  Surgeon: Deward CINDERELLA Piedmont, MD;  Location: [facility];  Service: Gastroenterology;  Laterality: N/A;   DILATION AND CURETTAGE OF UTERUS     ENDOMETRIAL ABLATION W/ NOVASURE      Prior to Admission medications   Medication Sig Start Date End Date Taking? Authorizing Provider  amitriptyline (ELAVIL) 50 MG tablet Take 50 mg by mouth at bedtime.   Yes [provider]  atorvastatin (LIPITOR) 20 MG tablet Take 20 mg by mouth daily.   Yes [provider]  bisoprolol-hydrochlorothiazide (ZIAC) 10-6.25 MG tablet Take 1 tablet by mouth daily.   Yes [provider]  glipiZIDE (GLUCOTROL) 5 MG tablet Take by mouth daily before breakfast.   Yes [provider]  insulin glargine (LANTUS) 100 UNIT/ML injection Inject 60 Units into the skin daily after supper. 05/16/17  Yes [provider]  Loratadine 10 MG CAPS Take by mouth.   Yes [provider]  losartan (COZAAR) 100 MG tablet TAKE ONE TABLET BY MOUTH ONCE DAILY 01/07/17  Yes [provider]  metFORMIN (GLUCOPHAGE) 500 MG tablet Take 1,000 mg by mouth 2 (two) times daily with a meal.   Yes  [provider]  omeprazole (PRILOSEC) 20 MG capsule Take 20 mg by mouth daily.   Yes [provider]    Allergies as of 02/24/2024   (No Known Allergies)    Family History  Problem Relation Age of Onset   Breast cancer Neg Hx     Social History   Socioeconomic History   Marital status: Married    Spouse name: Not on file   Number of children: Not on file   Years of education: Not on file   Highest education level: Not on file  Occupational History   Not on file  Tobacco Use   Smoking status: Never   Smokeless tobacco: Never  Substance and Sexual Activity   Alcohol use: No   Drug use: No   Sexual activity: Not on file  Other Topics Concern   Not on file  Social History Narrative   Not on file   Social Drivers of Health   Financial Resource Strain: Low Risk  (02/21/2024)   Received from [facility]   Overall Financial Resource Strain (CARDIA)    Difficulty of Paying Living Expenses: Not very hard  Food Insecurity: No Food Insecurity (02/21/2024)   Received from [facility]   Hunger Vital Sign    Within the past 12 months, you worried that your food would run out before you got the money to buy more.: Never true    Within the past 12 months, the food you bought just didn't last  and you didn't have money to get more.: Never true  Transportation Needs: No Transportation Needs (02/21/2024)   Received from [facility] - Transportation    In the past 12 months, has lack of transportation kept you from medical appointments or from getting medications?: No    Lack of Transportation (Non-Medical): No  Physical Activity: Not on file  Stress: Not on file  Social Connections: Not on file  Intimate Partner Violence: Not on file    Review of Systems: See HPI, otherwise negative ROS  Physical Exam: BP (!) 145/70   Temp (!) 97.4 F (36.3 C) (Temporal)   Resp (!) 21   Ht 5' 0.98 (1.549 m)    Wt 99.6 kg   SpO2 95%   BMI 41.50 kg/m  General:   Alert, cooperative. Head:  Normocephalic and atraumatic. Respiratory:  Normal work of breathing. Cardiovascular:  NAD  Impression/Plan: Amy Keith is here for cataract surgery.  Risks, benefits, limitations, and alternatives regarding cataract surgery have been reviewed with the patient.  Questions have been answered.  All parties agreeable.   [name], MD  03/30/2024, 9:58 AM

## 2024-03-30 NOTE — Anesthesia Postprocedure Evaluation (Signed)
 Anesthesia Post Note  Patient: Amy Keith  Procedure(s) Performed: PHACOEMULSIFICATION, CATARACT, WITH IOL INSERTION  8.42  00:49.1 (Left: Eye)  Patient location during evaluation: PACU Anesthesia Type: MAC Level of consciousness: awake and alert Pain management: pain level controlled Vital Signs Assessment: post-procedure vital signs reviewed and stable Respiratory status: spontaneous breathing, nonlabored ventilation, respiratory function stable and patient connected to nasal cannula oxygen Cardiovascular status: stable and blood pressure returned to baseline Postop Assessment: no apparent nausea or vomiting Anesthetic complications: no   No notable events documented.   Last Vitals:  Vitals:   03/30/24 1023 03/30/24 1029  BP: 127/78 115/73  Pulse: 74 71  Resp: (!) 9 14  Temp: (!) 36.1 C (!) 36.1 C  SpO2: 96% 95%    Last Pain:  Vitals:   03/30/24 1029  TempSrc:   PainSc: 0-No pain                 Jahmir Salo C Elona Yinger

## 2024-03-30 NOTE — Transfer of Care (Signed)
 Immediate Anesthesia Transfer of Care Note  Patient: Amy Keith  Procedure(s) Performed: PHACOEMULSIFICATION, CATARACT, WITH IOL INSERTION  8.42  00:49.1 (Left: Eye)  Patient Location: PACU  Anesthesia Type: MAC  Level of Consciousness: awake, alert  and patient cooperative  Airway and Oxygen Therapy: Patient Spontanous Breathing and Patient connected to supplemental oxygen  Post-op Assessment: Post-op Vital signs reviewed, Patient's Cardiovascular Status Stable, Respiratory Function Stable, Patent Airway and No signs of Nausea or vomiting  Post-op Vital Signs: Reviewed and stable  Complications: No notable events documented.

## 2024-03-30 NOTE — Anesthesia Preprocedure Evaluation (Signed)
 Anesthesia Evaluation  Patient identified by MRN, date of birth, ID band Patient awake    Reviewed: Allergy & Precautions, H&P , NPO status , Patient's Chart, lab work & pertinent test results  Airway Mallampati: III  TM Distance: >3 FB Neck ROM: Full    Dental no notable dental hx.    Pulmonary neg pulmonary ROS   Pulmonary exam normal breath sounds clear to auscultation       Cardiovascular hypertension, negative cardio ROS Normal cardiovascular exam Rhythm:Regular Rate:Normal     Neuro/Psych  PSYCHIATRIC DISORDERS  Depression    negative neurological ROS  negative psych ROS   GI/Hepatic negative GI ROS, Neg liver ROS,GERD  ,,  Endo/Other  negative endocrine ROSdiabetes    Renal/GU negative Renal ROS  negative genitourinary   Musculoskeletal negative musculoskeletal ROS (+) Arthritis ,    Abdominal   Peds negative pediatric ROS (+)  Hematology negative hematology ROS (+)   Anesthesia Other Findings Diabetes mellitus without complication (HCC) Hypertension Hyperlipidemia Allergic rhinitis Seasonal allergies GERD (gastroesophageal reflux disease) Arthritis Depression  Previous cataract surgery 03-16-24 Dr. Ardeen Mae  Reproductive/Obstetrics negative OB ROS                              Anesthesia Physical Anesthesia Plan  ASA: 3  Anesthesia Plan: MAC   Post-op Pain Management:    Induction: Intravenous  PONV Risk Score and Plan:   Airway Management Planned: Natural Airway and Nasal Cannula  Additional Equipment:   Intra-op Plan:   Post-operative Plan:   Informed Consent: I have reviewed the patients History and Physical, chart, labs and discussed the procedure including the risks, benefits and alternatives for the proposed anesthesia with the patient or authorized representative who has indicated his/her understanding and acceptance.     Dental Advisory  Given  Plan Discussed with: Anesthesiologist, CRNA and Surgeon  Anesthesia Plan Comments: (Patient consented for risks of anesthesia including but not limited to:  - adverse reactions to medications - damage to eyes, teeth, lips or other oral mucosa - nerve damage due to positioning  - sore throat or hoarseness - Damage to heart, brain, nerves, lungs, other parts of body or loss of life  Patient voiced understanding and assent.)         Anesthesia Quick Evaluation

## 2024-03-30 NOTE — Op Note (Signed)
 PREOPERATIVE DIAGNOSIS:  Nuclear sclerotic cataract of the left eye.   POSTOPERATIVE DIAGNOSIS:  Nuclear sclerotic cataract of the left eye.   OPERATIVE PROCEDURE:ORPROCALL@   SURGEON:  [name], MD.   ANESTHESIA:  Anesthesiologist: Ola Donny BROCKS, MD CRNA: Niki Manus SAUNDERS, CRNA  1.      Managed anesthesia care. 2.     0.51ml of Shugarcaine was instilled following the paracentesis   COMPLICATIONS:  None.   TECHNIQUE:   Stop and chop   DESCRIPTION OF PROCEDURE:  The patient was examined and consented in the preoperative holding area where the aforementioned topical anesthesia was applied to the left eye and then brought back to the Operating Room where the left eye was prepped and draped in the usual sterile ophthalmic fashion and a lid speculum was placed. A paracentesis was created with the side port blade and the anterior chamber was filled with viscoelastic. A near clear corneal incision was performed with the steel keratome. A continuous curvilinear capsulorrhexis was performed with a cystotome followed by the capsulorrhexis forceps. Hydrodissection and hydrodelineation were carried out with BSS on a blunt cannula. The lens was removed in a stop and chop  technique and the remaining cortical material was removed with the irrigation-aspiration handpiece. The capsular bag was inflated with viscoelastic and the Technis [id number] lens was placed in the capsular bag without complication. The remaining viscoelastic was removed from the eye with the irrigation-aspiration handpiece. The wounds were hydrated. The anterior chamber was flushed with BSS and the eye was inflated to physiologic pressure. 0.40ml Vigamox  was placed in the anterior chamber. The wounds were found to be water tight. The eye was dressed with Combigan . The patient was given protective glasses to wear throughout the day and a shield with which to sleep tonight. The patient was also given drops with which to begin a drop regimen  today and will follow-up with me in one day. Implant Name Type Inv. Item Serial No. Manufacturer Lot No. LRB No. Used Action  LENS IOL TECNIS EYHANCE 19.0 - [id number] Intraocular Lens LENS IOL TECNIS EYHANCE 19.0 [id number] [employer]  Left 1 Implanted    Procedure(s): PHACOEMULSIFICATION, CATARACT, WITH IOL INSERTION  8.42  00:49.1 (Left)  Electronically signed: [name] 03/30/2024 10:22 AM

## 2024-04-19 DIAGNOSIS — Z0100 Encounter for examination of eyes and vision without abnormal findings: Secondary | ICD-10-CM | POA: Diagnosis not present

## 2024-06-03 DIAGNOSIS — I10 Essential (primary) hypertension: Secondary | ICD-10-CM | POA: Diagnosis not present

## 2024-06-03 DIAGNOSIS — E119 Type 2 diabetes mellitus without complications: Secondary | ICD-10-CM | POA: Diagnosis not present

## 2024-06-03 DIAGNOSIS — E559 Vitamin D deficiency, unspecified: Secondary | ICD-10-CM | POA: Diagnosis not present

## 2024-06-03 DIAGNOSIS — E782 Mixed hyperlipidemia: Secondary | ICD-10-CM | POA: Diagnosis not present

## 2024-06-03 DIAGNOSIS — Z794 Long term (current) use of insulin: Secondary | ICD-10-CM | POA: Diagnosis not present

## 2024-08-06 NOTE — Progress Notes (Signed)
 Amy Keith                                          MRN: [id number]   08/06/2024   The [facility] Quality Team Specialist reviewed this patient medical record for the purposes of chart review for care gap closure. The following were reviewed: chart review for care gap closure-glycemic status assessment.    [facility] Quality Team
